# Patient Record
Sex: Female | Born: 1935 | Race: White | Hispanic: No | Smoking: Former smoker
Health system: Southern US, Community
[De-identification: ages and names within clinical notes are randomized; demographics above are authoritative.]

## PROBLEM LIST (undated history)

## (undated) DIAGNOSIS — K219 Gastro-esophageal reflux disease without esophagitis: Secondary | ICD-10-CM

## (undated) DIAGNOSIS — M1990 Unspecified osteoarthritis, unspecified site: Secondary | ICD-10-CM

## (undated) DIAGNOSIS — G459 Transient cerebral ischemic attack, unspecified: Secondary | ICD-10-CM

## (undated) DIAGNOSIS — M810 Age-related osteoporosis without current pathological fracture: Secondary | ICD-10-CM

## (undated) DIAGNOSIS — I639 Cerebral infarction, unspecified: Secondary | ICD-10-CM

## (undated) DIAGNOSIS — K635 Polyp of colon: Secondary | ICD-10-CM

## (undated) DIAGNOSIS — D649 Anemia, unspecified: Secondary | ICD-10-CM

## (undated) DIAGNOSIS — H269 Unspecified cataract: Secondary | ICD-10-CM

## (undated) DIAGNOSIS — I10 Essential (primary) hypertension: Secondary | ICD-10-CM

## (undated) DIAGNOSIS — N189 Chronic kidney disease, unspecified: Secondary | ICD-10-CM

## (undated) DIAGNOSIS — R011 Cardiac murmur, unspecified: Secondary | ICD-10-CM

## (undated) DIAGNOSIS — Z5189 Encounter for other specified aftercare: Secondary | ICD-10-CM

## (undated) DIAGNOSIS — K5790 Diverticulosis of intestine, part unspecified, without perforation or abscess without bleeding: Secondary | ICD-10-CM

## (undated) DIAGNOSIS — D689 Coagulation defect, unspecified: Secondary | ICD-10-CM

## (undated) DIAGNOSIS — E785 Hyperlipidemia, unspecified: Secondary | ICD-10-CM

## (undated) HISTORY — DX: Cerebral infarction, unspecified: I63.9

## (undated) HISTORY — DX: Age-related osteoporosis without current pathological fracture: M81.0

## (undated) HISTORY — PX: COLON SURGERY: SHX602

## (undated) HISTORY — DX: Cardiac murmur, unspecified: R01.1

## (undated) HISTORY — DX: Hyperlipidemia, unspecified: E78.5

## (undated) HISTORY — DX: Anemia, unspecified: D64.9

## (undated) HISTORY — DX: Unspecified osteoarthritis, unspecified site: M19.90

## (undated) HISTORY — DX: Gastro-esophageal reflux disease without esophagitis: K21.9

## (undated) HISTORY — PX: HERNIA REPAIR: SHX51

## (undated) HISTORY — PX: FOOT SURGERY: SHX648

## (undated) HISTORY — DX: Essential (primary) hypertension: I10

## (undated) HISTORY — PX: CHOLECYSTECTOMY: SHX55

## (undated) HISTORY — DX: Encounter for other specified aftercare: Z51.89

## (undated) HISTORY — DX: Polyp of colon: K63.5

## (undated) HISTORY — PX: INCONTINENCE SURGERY: SHX676

## (undated) HISTORY — DX: Diverticulosis of intestine, part unspecified, without perforation or abscess without bleeding: K57.90

## (undated) HISTORY — PX: BACK SURGERY: SHX140

## (undated) HISTORY — DX: Coagulation defect, unspecified: D68.9

## (undated) HISTORY — DX: Unspecified cataract: H26.9

---

## 2018-02-06 ENCOUNTER — Encounter (HOSPITAL_COMMUNITY): Payer: Self-pay | Admitting: Emergency Medicine

## 2018-02-06 ENCOUNTER — Observation Stay (HOSPITAL_COMMUNITY)
Admission: EM | Admit: 2018-02-06 | Discharge: 2018-02-08 | Disposition: A | Discharge status: Home / Self Care | Payer: Medicare Other | Attending: Internal Medicine | Admitting: Internal Medicine

## 2018-02-06 ENCOUNTER — Observation Stay (HOSPITAL_COMMUNITY): Payer: Medicare Other

## 2018-02-06 ENCOUNTER — Emergency Department (HOSPITAL_COMMUNITY): Payer: Medicare Other

## 2018-02-06 DIAGNOSIS — K219 Gastro-esophageal reflux disease without esophagitis: Secondary | ICD-10-CM | POA: Diagnosis not present

## 2018-02-06 DIAGNOSIS — I2510 Atherosclerotic heart disease of native coronary artery without angina pectoris: Secondary | ICD-10-CM | POA: Insufficient documentation

## 2018-02-06 DIAGNOSIS — R079 Chest pain, unspecified: Secondary | ICD-10-CM | POA: Diagnosis not present

## 2018-02-06 DIAGNOSIS — Z8673 Personal history of transient ischemic attack (TIA), and cerebral infarction without residual deficits: Secondary | ICD-10-CM | POA: Diagnosis not present

## 2018-02-06 DIAGNOSIS — R0602 Shortness of breath: Secondary | ICD-10-CM | POA: Insufficient documentation

## 2018-02-06 DIAGNOSIS — I129 Hypertensive chronic kidney disease with stage 1 through stage 4 chronic kidney disease, or unspecified chronic kidney disease: Secondary | ICD-10-CM | POA: Diagnosis not present

## 2018-02-06 DIAGNOSIS — Z79899 Other long term (current) drug therapy: Secondary | ICD-10-CM | POA: Insufficient documentation

## 2018-02-06 DIAGNOSIS — I10 Essential (primary) hypertension: Secondary | ICD-10-CM | POA: Diagnosis not present

## 2018-02-06 DIAGNOSIS — N189 Chronic kidney disease, unspecified: Secondary | ICD-10-CM | POA: Diagnosis not present

## 2018-02-06 DIAGNOSIS — R0902 Hypoxemia: Secondary | ICD-10-CM

## 2018-02-06 DIAGNOSIS — Z7982 Long term (current) use of aspirin: Secondary | ICD-10-CM | POA: Diagnosis not present

## 2018-02-06 DIAGNOSIS — R0789 Other chest pain: Secondary | ICD-10-CM

## 2018-02-06 HISTORY — DX: Chronic kidney disease, unspecified: N18.9

## 2018-02-06 HISTORY — DX: Transient cerebral ischemic attack, unspecified: G45.9

## 2018-02-06 LAB — CBC
HCT: 33.5 % — ABNORMAL LOW (ref 36.0–46.0)
Hemoglobin: 11.3 g/dL — ABNORMAL LOW (ref 12.0–15.0)
MCH: 32.6 pg (ref 26.0–34.0)
MCHC: 33.7 g/dL (ref 30.0–36.0)
MCV: 96.5 fL (ref 78.0–100.0)
Platelets: 350 K/uL (ref 150–400)
RBC: 3.47 MIL/uL — ABNORMAL LOW (ref 3.87–5.11)
RDW: 14.1 % (ref 11.5–15.5)
WBC: 4.4 K/uL (ref 4.0–10.5)

## 2018-02-06 LAB — COMPREHENSIVE METABOLIC PANEL
ALBUMIN: 3.2 g/dL — AB (ref 3.5–5.0)
ALK PHOS: 86 U/L (ref 38–126)
ALT: 17 U/L (ref 14–54)
ANION GAP: 10 (ref 5–15)
AST: 40 U/L (ref 15–41)
BILIRUBIN TOTAL: 1.3 mg/dL — AB (ref 0.3–1.2)
BUN: 13 mg/dL (ref 6–20)
CALCIUM: 8 mg/dL — AB (ref 8.9–10.3)
CO2: 20 mmol/L — ABNORMAL LOW (ref 22–32)
Chloride: 110 mmol/L (ref 101–111)
Creatinine, Ser: 0.8 mg/dL (ref 0.44–1.00)
GFR calc Af Amer: >60 mL/min (ref >60)
GLUCOSE: 79 mg/dL (ref 65–99)
POTASSIUM: 5.1 mmol/L (ref 3.5–5.1)
Sodium: 140 mmol/L (ref 135–145)
TOTAL PROTEIN: 5.3 g/dL — AB (ref 6.5–8.1)

## 2018-02-06 LAB — TROPONIN I: Troponin I: <0.03 ng/mL (ref <0.03)

## 2018-02-06 LAB — I-STAT TROPONIN, ED: TROPONIN I, POC: 0.01 ng/mL (ref 0.00–0.08)

## 2018-02-06 LAB — LIPASE, BLOOD: LIPASE: 32 U/L (ref 11–51)

## 2018-02-06 LAB — BRAIN NATRIURETIC PEPTIDE: B Natriuretic Peptide: 58 pg/mL (ref 0.0–100.0)

## 2018-02-06 MED ORDER — NITROGLYCERIN 0.4 MG SL SUBL
0.4000 mg | SUBLINGUAL_TABLET | SUBLINGUAL | Status: DC | PRN
  Administered 2018-02-06: 0.4 mg via SUBLINGUAL
  Filled 2018-02-06: qty 1

## 2018-02-06 MED ORDER — GABAPENTIN 300 MG PO CAPS
600.0000 mg | ORAL_CAPSULE | Freq: Every day | ORAL | Status: DC
  Administered 2018-02-07 – 2018-02-08 (×2): 600 mg via ORAL
  Filled 2018-02-06 (×2): qty 2

## 2018-02-06 MED ORDER — IOPAMIDOL (ISOVUE-370) INJECTION 76%
INTRAVENOUS | Status: AC
Start: 2018-02-06 — End: 2018-02-06
  Administered 2018-02-06: 100 mL
  Filled 2018-02-06: qty 100

## 2018-02-06 MED ORDER — ASPIRIN 325 MG PO TABS
325.0000 mg | ORAL_TABLET | Freq: Every day | ORAL | Status: DC
  Administered 2018-02-07 – 2018-02-08 (×2): 325 mg via ORAL
  Filled 2018-02-06 (×3): qty 1

## 2018-02-06 MED ORDER — GABAPENTIN 300 MG PO CAPS
900.0000 mg | ORAL_CAPSULE | Freq: Every day | ORAL | Status: DC
  Administered 2018-02-07 (×2): 900 mg via ORAL
  Filled 2018-02-06 (×2): qty 3

## 2018-02-06 MED ORDER — ENOXAPARIN SODIUM 40 MG/0.4ML ~~LOC~~ SOLN
40.0000 mg | SUBCUTANEOUS | Status: DC
  Administered 2018-02-07: 40 mg via SUBCUTANEOUS
  Filled 2018-02-06 (×2): qty 0.4

## 2018-02-06 MED ORDER — VERAPAMIL HCL ER 120 MG PO TBCR
120.0000 mg | EXTENDED_RELEASE_TABLET | Freq: Every day | ORAL | Status: DC
  Administered 2018-02-07 – 2018-02-08 (×2): 120 mg via ORAL
  Filled 2018-02-06 (×3): qty 1

## 2018-02-06 MED ORDER — VERAPAMIL HCL ER 240 MG PO CP24: 240.0000 mg | ORAL_CAPSULE | Freq: Every day | ORAL | Status: DC

## 2018-02-06 MED ORDER — MORPHINE SULFATE (PF) 4 MG/ML IV SOLN: 2.0000 mg | INTRAVENOUS | Status: DC | PRN

## 2018-02-06 MED ORDER — ONDANSETRON HCL 4 MG/2ML IJ SOLN
4.0000 mg | Freq: Four times a day (QID) | INTRAMUSCULAR | Status: DC | PRN
Start: 2018-02-06 — End: 2018-02-08

## 2018-02-06 MED ORDER — ACETAMINOPHEN 325 MG PO TABS: 650.0000 mg | ORAL_TABLET | ORAL | Status: DC | PRN

## 2018-02-06 MED ORDER — ONDANSETRON HCL 4 MG/2ML IJ SOLN
4.0000 mg | Freq: Once | INTRAMUSCULAR | Status: AC
  Administered 2018-02-07: 4 mg via INTRAVENOUS
  Filled 2018-02-06: qty 2

## 2018-02-06 MED ORDER — SODIUM CHLORIDE 0.9 % IV SOLN
Freq: Once | INTRAVENOUS | Status: AC
  Administered 2018-02-07: via INTRAVENOUS

## 2018-02-06 MED ORDER — FENTANYL CITRATE (PF) 100 MCG/2ML IJ SOLN
50.0000 ug | Freq: Once | INTRAMUSCULAR | Status: AC
  Administered 2018-02-07: 50 ug via INTRAVENOUS
  Filled 2018-02-06: qty 2

## 2018-02-06 NOTE — H&P (Addendum)
History and Physical    Jill Mcdonald NWG:[id number] DOB: 1930-02-26 DOA: 02/06/2018  PCP: Adrian Prince, MD  Patient coming from: Home  I have personally briefly reviewed patient's old medical records in [facility]  Chief Complaint: Chest pain  HPI: Jill Mcdonald is a 84 y.o. female with medical history significant of TIA, HTN.  Patient presents to the ED with c/o central CP.  Dull, aching, substernal, pressure like sensation.  Ongoing for past several weeks.  Associated SOB.  Worse with exertion.  Over last several hours has had acute CP even at rest.  Actually got worse when EMS tried NTG on her.   ED Course: CP currently 5/10.  Noted to be hypoxic on room air.  Trop neg.  CTA chest PE pending.  CXR does show an age indeterminate compression fracture.   Review of Systems: As per HPI otherwise 10 point review of systems negative.   Past Medical History:  Diagnosis Date  . CKD (chronic kidney disease)   . TIA (transient ischemic attack)     History reviewed. No pertinent surgical history.   reports that  has never smoked. she has never used smokeless tobacco. She reports that she does not drink alcohol or use drugs.  Allergies  Allergen Reactions  . Other     Patient states she is allergic to 36 things but does not remember the name.     No family history on file.   Prior to Admission medications   Medication Sig Start Date End Date Taking? Authorizing Provider  aspirin 325 MG tablet Take 325 mg by mouth daily.   Yes [provider]  gabapentin (NEURONTIN) 300 MG capsule Take 600 mg by mouth 2 (two) times daily.   Yes [provider]  ranitidine (ZANTAC) 150 MG tablet Take 150 mg by mouth daily as needed for heartburn.   Yes [provider]  traMADol (ULTRAM) 50 MG tablet Take 50 mg by mouth every 6 (six) hours as needed for moderate pain.   Yes [provider]  verapamil (CALAN) 120 MG tablet Take 120 mg by mouth at  bedtime.   Yes [provider]  verapamil (VERELAN PM) 240 MG 24 hr capsule Take 240 mg by mouth daily.   Yes [provider]  zolpidem (AMBIEN) 5 MG tablet Take 5 mg by mouth at bedtime as needed for sleep.   Yes [provider]    Physical Exam: Vitals:   02/06/18 2100 02/06/18 2111 02/06/18 2112 02/06/18 2130  BP:  120/73  134/86  Pulse: 77  85 83  Resp: (!) 30  (!) 21 17  SpO2: 94%  93% (!) 87%  Weight:      Height:        Constitutional: NAD, calm, comfortable Eyes: PERRL, lids and conjunctivae normal ENMT: Mucous membranes are moist. Posterior pharynx clear of any exudate or lesions.Normal dentition.  Neck: normal, supple, no masses, no thyromegaly Respiratory: clear to auscultation bilaterally, no wheezing, no crackles. Normal respiratory effort. No accessory muscle use.  Cardiovascular: Regular rate and rhythm, no murmurs / rubs / gallops. No extremity edema. 2+ pedal pulses. No carotid bruits.  Abdomen: no tenderness, no masses palpated. No hepatosplenomegaly. Bowel sounds positive.  Musculoskeletal: no clubbing / cyanosis. No joint deformity upper and lower extremities. Good ROM, no contractures. Normal muscle tone.  Skin: no rashes, lesions, ulcers. No induration Neurologic: CN 2-12 grossly intact. Sensation intact, DTR normal. Strength 5/5 in all 4.  Psychiatric:  Normal judgment and insight. Alert and oriented x 3. Normal mood.    Labs on Admission: I have personally reviewed following labs and imaging studies  CBC: Recent Labs  Lab 02/06/18 1803  WBC 4.4  HGB 11.3*  HCT 33.5*  MCV 96.5  PLT 350   Basic Metabolic Panel: Recent Labs  Lab 02/06/18 1820  NA 140  K 5.1  CL 110  CO2 20*  GLUCOSE 79  BUN 13  CREATININE 0.80  CALCIUM 8.0*   GFR: Estimated Creatinine Clearance: 37.6 mL/min (by C-G formula based on SCr of 0.8 mg/dL). Liver Function Tests: Recent Labs  Lab 02/06/18 1820  AST 40  ALT 17  ALKPHOS 86  BILITOT  1.3*  PROT 5.3*  ALBUMIN 3.2*   Recent Labs  Lab 02/06/18 1820  LIPASE 32   No results for input(s): AMMONIA in the last 168 hours. Coagulation Profile: No results for input(s): INR, PROTIME in the last 168 hours. Cardiac Enzymes: Recent Labs  Lab 02/06/18 1820  TROPONINI <0.03   BNP (last 3 results) No results for input(s): PROBNP in the last 8760 hours. HbA1C: No results for input(s): HGBA1C in the last 72 hours. CBG: No results for input(s): GLUCAP in the last 168 hours. Lipid Profile: No results for input(s): CHOL, HDL, LDLCALC, TRIG, CHOLHDL, LDLDIRECT in the last 72 hours. Thyroid Function Tests: No results for input(s): TSH, T4TOTAL, FREET4, T3FREE, THYROIDAB in the last 72 hours. Anemia Panel: No results for input(s): VITAMINB12, FOLATE, FERRITIN, TIBC, IRON, RETICCTPCT in the last 72 hours. Urine analysis: No results found for: COLORURINE, APPEARANCEUR, LABSPEC, PHURINE, GLUCOSEU, HGBUR, BILIRUBINUR, KETONESUR, PROTEINUR, UROBILINOGEN, NITRITE, LEUKOCYTESUR  Radiological Exams on Admission: Dg Chest 2 View  Result Date: 02/06/2018 CLINICAL DATA:  84 y/o F; 1 day of central chest pain and shortness of breath. EXAM: CHEST - 2 VIEW COMPARISON:  None. FINDINGS: Midthoracic anterior compression deformity with 60% loss of height, probably T7. Bones are otherwise unremarkable. Cardiomediastinal silhouette within normal limits given projection and technique. Calcific aortic atherosclerosis. Clear lungs. IMPRESSION: No acute pulmonary process identified. T7 moderate to severe anterior compression deformity, age indeterminate. Electronically Signed   By: Mitzi HansenLance  Furusawa-Stratton M.D.   On: 02/06/2018 18:33    EKG: Independently reviewed.  Assessment/Plan Principal Problem:   Chest pain, rule out acute myocardial infarction Active Problems:   HTN (hypertension)    1. CP r/o - 1. CTA chest PE pending - if positive for something then will treat that obviously 2. CP obs  pathway 3. Serial trops 4. Tele monitor 5. NPO after MN 6. Cards eval in AM 7. Morphine PRN pain 2. HTN - continue calan  DVT prophylaxis: Lovenox [place] Code Status: Full Family Communication: Daughter at bedside Disposition Plan: Home after admit Consults called: EDP spoke with cards who said for med to admit, put in msg to P.Trent for eval in AM Admission status: [place]   [place], Kentucky[place] M. DO [facility] Pager [phone]  If 7AM-7PM, please contact day team taking care of patient [email or web address] Password [email or web address]  02/06/2018, 10:00 PM

## 2018-02-06 NOTE — ED Triage Notes (Signed)
Per EMS: Pt c/o CP, central, tightness x 2 weeks w/ SOB. Pain getting worse today. Pt given 324 ASP and 1 of nitro. VSS NSR.A&Ox4. Pt from [facility].

## 2018-02-06 NOTE — ED Notes (Signed)
Placed Pt on 2L [place]

## 2018-02-06 NOTE — ED Provider Notes (Addendum)
[facility]   CSN: [id number] Arrival date & time: 02/06/18  1747     History   Chief Complaint Chief Complaint  Patient presents with  . Chest Pain    HPI Charlett BlakeShelia M Knobloch is a 84 y.o. female.  HPI very pleasant 84 year old female with history of hypertension and mild CKD here with chest pain.  The patient states that for the last several weeks, she is had progressively worsening dull, aching, substernal chest pressure.  She is noticed that she has had chest pressure and shortness of breath with exertion.  This has become increasingly more severe with less and less exertion.  She is now having symptoms at rest.  The patient has also had some occasional episodes of symptoms that occur even at rest.  Over the last several hours, she had acute onset of this pain at rest.  It is severe.  She has associated sensation that she cannot catch her breath.  No nausea or diaphoresis.  No history of previous coronary disease.  She did reportedly have a cardiac evaluation but this was approximately 10 years ago.  Past Medical History:  Diagnosis Date  . CKD (chronic kidney disease)   . TIA (transient ischemic attack)     Patient Active Problem List   Diagnosis Date Noted  . Chest pain, rule out acute myocardial infarction 02/06/2018  . HTN (hypertension) 02/06/2018    History reviewed. No pertinent surgical history.  OB History    No data available       Home Medications    Prior to Admission medications   Medication Sig Start Date End Date Taking? Authorizing Provider  aspirin 325 MG tablet Take 325 mg by mouth daily.   Yes [provider]  gabapentin (NEURONTIN) 300 MG capsule Take 600 mg by mouth 2 (two) times daily.   Yes [provider]  ranitidine (ZANTAC) 150 MG tablet Take 150 mg by mouth daily as needed for heartburn.   Yes [provider]  traMADol (ULTRAM) 50 MG tablet Take 50 mg by mouth  every 6 (six) hours as needed for moderate pain.   Yes [provider]  verapamil (CALAN) 120 MG tablet Take 120 mg by mouth at bedtime.   Yes [provider]  verapamil (VERELAN PM) 240 MG 24 hr capsule Take 240 mg by mouth daily.   Yes [provider]  zolpidem (AMBIEN) 5 MG tablet Take 5 mg by mouth at bedtime as needed for sleep.   Yes [provider]    Family History No family history on file.  Social History Social History   Tobacco Use  . Smoking status: Never Smoker  . Smokeless tobacco: Never Used  Substance Use Topics  . Alcohol use: No    Frequency: Never  . Drug use: No     Allergies   Other   Review of Systems Review of Systems  Respiratory: Positive for cough, chest tightness and shortness of breath.   Cardiovascular: Positive for chest pain.  Neurological: Positive for weakness.  All other systems reviewed and are negative.    Physical Exam Updated Vital Signs BP (!) 152/85   Pulse 83   Resp 17   Ht 5\' 1"  (1.549 m)   Wt 46.3 kg (102 lb)   SpO2 (!) 87%   BMI 19.27 kg/m   Physical Exam  Constitutional: She is oriented to person, place, and time. She appears well-developed and well-nourished. No distress.  HENT:  Head: Normocephalic and atraumatic.  Eyes: Conjunctivae are normal.  Neck: Neck supple.  Cardiovascular: Normal rate, regular rhythm and normal heart sounds. Exam reveals no friction rub.  No murmur heard. Pulmonary/Chest: Effort normal and breath sounds normal. No respiratory distress. She has no wheezes. She has no rales.  Abdominal: She exhibits no distension.  Musculoskeletal: She exhibits no edema.  Neurological: She is alert and oriented to person, place, and time. She exhibits normal muscle tone.  Skin: Skin is warm. Capillary refill takes less than 2 seconds.  Psychiatric: She has a normal mood and affect.  Nursing note and vitals reviewed.    ED Treatments / Results  Labs (all labs  ordered are listed, but only abnormal results are displayed) Labs Reviewed  CBC - Abnormal; Notable for the following components:      Result Value   RBC 3.47 (*)    Hemoglobin 11.3 (*)    HCT 33.5 (*)    All other components within normal limits  COMPREHENSIVE METABOLIC PANEL - Abnormal; Notable for the following components:   CO2 20 (*)    Calcium 8.0 (*)    Total Protein 5.3 (*)    Albumin 3.2 (*)    Total Bilirubin 1.3 (*)    All other components within normal limits  LIPASE, BLOOD  TROPONIN I  BRAIN NATRIURETIC PEPTIDE  TROPONIN I  TROPONIN I  TROPONIN I  I-STAT TROPONIN, ED    EKG  EKG Interpretation  Date/Time:  Thursday February 06 2018 18:02:55 EST Ventricular Rate:  81 PR Interval:    QRS Duration: 100 QT Interval:  418 QTC Calculation: 486 R Axis:   -36 Text Interpretation:  Sinus rhythm Left axis deviation Borderline low voltage, extremity leads Borderline prolonged QT interval No old tracing to compare Confirmed by Shaune Pollack [phone]) on 02/06/2018 6:50:39 PM       Radiology Dg Chest 2 View  Result Date: 02/06/2018 CLINICAL DATA:  84 y/o F; 1 day of central chest pain and shortness of breath. EXAM: CHEST - 2 VIEW COMPARISON:  None. FINDINGS: Midthoracic anterior compression deformity with 60% loss of height, probably T7. Bones are otherwise unremarkable. Cardiomediastinal silhouette within normal limits given projection and technique. Calcific aortic atherosclerosis. Clear lungs. IMPRESSION: No acute pulmonary process identified. T7 moderate to severe anterior compression deformity, age indeterminate. Electronically Signed   By: Mitzi Hansen M.D.   On: 02/06/2018 18:33   Ct Angio Chest Pe W Or Wo Contrast  Result Date: 02/06/2018 CLINICAL DATA:  Short of breath EXAM: CT ANGIOGRAPHY CHEST WITH CONTRAST TECHNIQUE: Multidetector CT imaging of the chest was performed using the standard protocol during bolus administration of intravenous contrast.  Multiplanar CT image reconstructions and MIPs were obtained to evaluate the vascular anatomy. CONTRAST:  ISOVUE-370 IOPAMIDOL (ISOVUE-370) INJECTION 76% COMPARISON:  Chest x-ray 02/06/2018 FINDINGS: Cardiovascular: Satisfactory opacification of the pulmonary arteries to the segmental level. No evidence of pulmonary embolism. Ectatic ascending aorta measuring up to 3.8 cm. Common origin of the right brachiocephalic and left common carotid artery. Moderate aortic atherosclerosis. No dissection seen. Coronary artery calcification. Borderline heart size. No significant pericardial effusion. Mediastinum/Nodes: Midline trachea. No thyroid mass. No significantly enlarged lymph nodes. Esophagus within normal limits. Small calcified subcarinal lymph node consistent with prior granulomatous disease. Lungs/Pleura: No focal consolidation or effusion. Granuloma in the left lower lobe. Minimal tree-in-bud density peripheral right upper lobe. Upper Abdomen: No acute abnormality. Musculoskeletal: Severe compression fracture at T7, likely chronic. Thoracic stimulator the  tip at T8. Review of the MIP images confirms the above findings. IMPRESSION: 1. Negative for acute pulmonary embolus. 2. Minimal tree-in-bud density in the right upper lobe peripherally suggesting bronchiolitis/infection. No focal consolidation 3. Prior granulomatous disease. 4. Severe chronic appearing compression fracture at T7 Aortic Atherosclerosis (ICD10-I70.0). Electronically Signed   By: Jasmine Pang M.D.   On: 02/06/2018 23:08    Procedures Procedures (including critical care time)  Medications Ordered in ED Medications  nitroGLYCERIN (NITROSTAT) SL tablet 0.4 mg (0.4 mg Sublingual Given 02/06/18 2100)  verapamil (CALAN-SR) CR tablet 120 mg (120 mg Oral Given 02/07/18 0001)  verapamil (VERELAN PM) 24 hr capsule CP24 240 mg (not administered)  gabapentin (NEURONTIN) capsule 900 mg (900 mg Oral Given 02/07/18 0000)  aspirin tablet 325 mg (not  administered)  morphine 4 MG/ML injection 2 mg (not administered)  acetaminophen (TYLENOL) tablet 650 mg (not administered)  ondansetron (ZOFRAN) injection 4 mg (not administered)  enoxaparin (LOVENOX) injection 40 mg (not administered)  gabapentin (NEURONTIN) capsule 600 mg (not administered)  fentaNYL (SUBLIMAZE) injection 50 mcg (50 mcg Intravenous Given 02/07/18 0000)  ondansetron (ZOFRAN) injection 4 mg (4 mg Intravenous Given 02/07/18 0000)  0.9 %  sodium chloride infusion ( Intravenous New Bag/Given 02/07/18 0006)  iopamidol (ISOVUE-370) 76 % injection (100 mLs  Contrast Given 02/06/18 2220)     Initial Impression / Assessment and Plan / ED Course  I have reviewed the triage vital signs and the nursing notes.  Pertinent labs & imaging results that were available during my care of the patient were reviewed by me and considered in my medical decision making (see chart for details).    84 year old female here with a story very concerning for possible worsening unstable angina.  However, EKG here is nonischemic and initial troponin is negative.  Given her story, I suspect she should be admitted for high risk chest pain evaluation.  Otherwise, chest x-ray clear without evidence of pneumonia, pneumothorax, or other abnormality.  She has no known coronary disease.  Discussed the case with cardiology on-call, who is available for consultation and agrees with hospitalist admission at this time.  Will trial opiates for pain control as she had poor response to nitroglycerin with a headache.  Otherwise, pt noted to be mildly hypoxic here. Given reported CP, tachypnea CT angio ordered - Dr. Beacher May to f/u. Admit to Hospitalist.  Final Clinical Impressions(s) / ED Diagnoses   Final diagnoses:  Atypical chest pain  Hypoxia    ED Discharge Orders    None       Shaune Pollack, MD 02/07/18 Venita Sheffield, MD 02/07/18 [phone]

## 2018-02-07 ENCOUNTER — Encounter (HOSPITAL_COMMUNITY)
Admission: EM | Disposition: A | Discharge status: Home / Self Care | Payer: Self-pay | Source: Home / Self Care | Attending: Emergency Medicine

## 2018-02-07 ENCOUNTER — Observation Stay (HOSPITAL_COMMUNITY): Payer: Medicare Other

## 2018-02-07 ENCOUNTER — Encounter (HOSPITAL_COMMUNITY): Payer: Self-pay | Admitting: Physician Assistant

## 2018-02-07 DIAGNOSIS — I2510 Atherosclerotic heart disease of native coronary artery without angina pectoris: Secondary | ICD-10-CM | POA: Diagnosis not present

## 2018-02-07 DIAGNOSIS — K219 Gastro-esophageal reflux disease without esophagitis: Secondary | ICD-10-CM | POA: Diagnosis not present

## 2018-02-07 DIAGNOSIS — I10 Essential (primary) hypertension: Secondary | ICD-10-CM | POA: Diagnosis not present

## 2018-02-07 DIAGNOSIS — R079 Chest pain, unspecified: Secondary | ICD-10-CM | POA: Diagnosis not present

## 2018-02-07 HISTORY — PX: LEFT HEART CATH AND CORONARY ANGIOGRAPHY: CATH118249

## 2018-02-07 LAB — TROPONIN I: Troponin I: <0.03 ng/mL (ref <0.03)

## 2018-02-07 SURGERY — LEFT HEART CATH AND CORONARY ANGIOGRAPHY
Anesthesia: LOCAL

## 2018-02-07 MED ORDER — SODIUM CHLORIDE 0.9 % IV SOLN: INTRAVENOUS | Status: AC

## 2018-02-07 MED ORDER — VERAPAMIL HCL 2.5 MG/ML IV SOLN
INTRAVENOUS | Status: AC
  Filled 2018-02-07: qty 2

## 2018-02-07 MED ORDER — ASPIRIN 81 MG PO CHEW: 81.0000 mg | CHEWABLE_TABLET | ORAL | Status: DC

## 2018-02-07 MED ORDER — ENOXAPARIN SODIUM 40 MG/0.4ML ~~LOC~~ SOLN
40.0000 mg | SUBCUTANEOUS | Status: DC
  Filled 2018-02-07: qty 0.4

## 2018-02-07 MED ORDER — LIDOCAINE HCL (PF) 1 % IJ SOLN
INTRAMUSCULAR | Status: DC | PRN
  Administered 2018-02-07: 2 mL

## 2018-02-07 MED ORDER — ENSURE ENLIVE PO LIQD
237.0000 mL | Freq: Two times a day (BID) | ORAL | Status: DC
  Administered 2018-02-08: 237 mL via ORAL

## 2018-02-07 MED ORDER — VERAPAMIL HCL 2.5 MG/ML IV SOLN
INTRAVENOUS | Status: DC | PRN
  Administered 2018-02-07: 10 mL via INTRA_ARTERIAL

## 2018-02-07 MED ORDER — SODIUM CHLORIDE 0.9 % IV SOLN: 250.0000 mL | INTRAVENOUS | Status: DC | PRN

## 2018-02-07 MED ORDER — HEPARIN SODIUM (PORCINE) 1000 UNIT/ML IJ SOLN
INTRAMUSCULAR | Status: AC
  Filled 2018-02-07: qty 1

## 2018-02-07 MED ORDER — MIDAZOLAM HCL 2 MG/2ML IJ SOLN
INTRAMUSCULAR | Status: AC
  Filled 2018-02-07: qty 2

## 2018-02-07 MED ORDER — ZOLPIDEM TARTRATE 5 MG PO TABS
5.0000 mg | ORAL_TABLET | Freq: Every evening | ORAL | Status: DC | PRN
  Administered 2018-02-07: 5 mg via ORAL
  Filled 2018-02-07: qty 1

## 2018-02-07 MED ORDER — ACETAMINOPHEN 325 MG PO TABS: 650.0000 mg | ORAL_TABLET | ORAL | Status: DC | PRN

## 2018-02-07 MED ORDER — VERAPAMIL HCL ER 240 MG PO TBCR
240.0000 mg | EXTENDED_RELEASE_TABLET | Freq: Every day | ORAL | Status: DC
  Administered 2018-02-08: 240 mg via ORAL
  Filled 2018-02-07: qty 1

## 2018-02-07 MED ORDER — FENTANYL CITRATE (PF) 100 MCG/2ML IJ SOLN
INTRAMUSCULAR | Status: AC
  Filled 2018-02-07: qty 2

## 2018-02-07 MED ORDER — SODIUM CHLORIDE 0.9 % WEIGHT BASED INFUSION: 1.0000 mL/kg/h | INTRAVENOUS | Status: DC

## 2018-02-07 MED ORDER — LIDOCAINE HCL (PF) 1 % IJ SOLN
INTRAMUSCULAR | Status: AC
  Filled 2018-02-07: qty 30

## 2018-02-07 MED ORDER — ONDANSETRON HCL 4 MG/2ML IJ SOLN: 4.0000 mg | Freq: Four times a day (QID) | INTRAMUSCULAR | Status: DC | PRN

## 2018-02-07 MED ORDER — SODIUM CHLORIDE 0.9 % WEIGHT BASED INFUSION: 3.0000 mL/kg/h | INTRAVENOUS | Status: DC

## 2018-02-07 MED ORDER — MIDAZOLAM HCL 2 MG/2ML IJ SOLN
INTRAMUSCULAR | Status: DC | PRN
  Administered 2018-02-07: 1 mg via INTRAVENOUS

## 2018-02-07 MED ORDER — HEPARIN SODIUM (PORCINE) 1000 UNIT/ML IJ SOLN
INTRAMUSCULAR | Status: DC | PRN
  Administered 2018-02-07: 2000 [IU] via INTRAVENOUS

## 2018-02-07 MED ORDER — FENTANYL CITRATE (PF) 100 MCG/2ML IJ SOLN
INTRAMUSCULAR | Status: DC | PRN
  Administered 2018-02-07: 25 ug via INTRAVENOUS

## 2018-02-07 MED ORDER — FAMOTIDINE 20 MG PO TABS
20.0000 mg | ORAL_TABLET | Freq: Every day | ORAL | Status: DC
  Administered 2018-02-08: 20 mg via ORAL
  Filled 2018-02-07: qty 1

## 2018-02-07 MED ORDER — SODIUM CHLORIDE 0.9% FLUSH: 3.0000 mL | INTRAVENOUS | Status: DC | PRN

## 2018-02-07 MED ORDER — SODIUM CHLORIDE 0.9% FLUSH: 3.0000 mL | Freq: Two times a day (BID) | INTRAVENOUS | Status: DC

## 2018-02-07 MED ORDER — SODIUM CHLORIDE 0.9 % IV SOLN
INTRAVENOUS | Status: DC
  Administered 2018-02-07: 14:00:00 via INTRAVENOUS

## 2018-02-07 MED ORDER — IOPAMIDOL (ISOVUE-370) INJECTION 76%
INTRAVENOUS | Status: AC
  Filled 2018-02-07: qty 100

## 2018-02-07 MED ORDER — IOPAMIDOL (ISOVUE-370) INJECTION 76%
INTRAVENOUS | Status: DC | PRN
  Administered 2018-02-07: 60 mL via INTRA_ARTERIAL

## 2018-02-07 MED ORDER — HEPARIN (PORCINE) IN NACL 2-0.9 UNIT/ML-% IJ SOLN
INTRAMUSCULAR | Status: AC | PRN
  Administered 2018-02-07 (×2): 500 mL

## 2018-02-07 MED ORDER — HEPARIN (PORCINE) IN NACL 2-0.9 UNIT/ML-% IJ SOLN
INTRAMUSCULAR | Status: AC
  Filled 2018-02-07: qty 1000

## 2018-02-07 SURGICAL SUPPLY — 12 items
CATH IMPULSE 5F ANG/FL3.5 (CATHETERS) ×2 IMPLANT
CATH INFINITI 5FR JL4 (CATHETERS) ×2 IMPLANT
DEVICE RAD COMP TR BAND LRG (VASCULAR PRODUCTS) IMPLANT
DEVICE RAD TR BAND REGULAR (VASCULAR PRODUCTS) ×2 IMPLANT
GLIDESHEATH SLEND SS 6F .021 (SHEATH) ×4 IMPLANT
GUIDEWIRE INQWIRE 1.5J.035X260 (WIRE) ×1 IMPLANT
INQWIRE 1.5J .035X260CM (WIRE) ×2
KIT HEART LEFT (KITS) ×2 IMPLANT
PACK CARDIAC CATHETERIZATION (CUSTOM PROCEDURE TRAY) ×2 IMPLANT
TRANSDUCER W/STOPCOCK (MISCELLANEOUS) ×2 IMPLANT
TUBING CIL FLEX 10 FLL-RA (TUBING) ×2 IMPLANT
WIRE HI TORQ VERSACORE-J 145CM (WIRE) ×2 IMPLANT

## 2018-02-07 NOTE — Consult Note (Addendum)
Cardiology Consultation:   Patient ID: Jill Mcdonald; [id number]; Apr 08, 1930   Admit date: 02/06/2018 Date of Consult: 02/07/2018  Primary Care Provider: Adrian Prince, MD Primary Cardiologist: new - Dr. Tresa Endo Primary Electrophysiologist:     Patient Profile:   Jill Mcdonald is a 84 y.o. female with a hx of HTN, TIA x 2, and ?CKD who is being seen today for the evaluation of chest pain at the request of Dr. Malachi Bonds.  History of Present Illness:   [name] was a resident of [place], [place] and recently moved here last fall. She lives in an assisted living center and has a daughter in the area. She had a cardiologist and a nephrologist in [place]. She had a heart cath in Jan/2006 and was told she had blockages, but no stents placed. (Awaiting records).   It sounds as though her anginal equivalent is shortness of breath. She had SOB and DOE prior to her cath in 2006. She never had chest pain. She states that she started getting SOB again last August 2018. She did not see her cardiologist for this because she knew she was relocating to [facility]. Her SOB and DOE have gotten much worse over the past several weeks. Two days ago, she also noticed a new chest pain in her central inferior sternal area. The chest pain has been constant for the past 2 days. She denies orthopnea, but can't lay flat because of back pain. She denies lower extremity swelling, palpitations, dizziness, and pre/syncope. Yesterday, she was too weak to even get off the cough and EMS was dispatched to her room. She also reports a significant weight loss over the past several weeks. She is very active and independent on most ADLs. She has not fallen in the past 3 years.  On arrival to the ER, initial troponin was negative. EKG with poor R wave progression, no prior for comparison. She appears to be euvolemic, maybe even a bit dehydrated.    Past Medical History:  Diagnosis Date  . CKD (chronic kidney disease)   . TIA (transient ischemic  attack)     History reviewed. No pertinent surgical history.   Home Medications:  Prior to Admission medications   Medication Sig Start Date End Date Taking? Authorizing Provider  aspirin 325 MG tablet Take 325 mg by mouth daily.   Yes [provider]  gabapentin (NEURONTIN) 300 MG capsule Take 600 mg by mouth 2 (two) times daily.   Yes [provider]  ranitidine (ZANTAC) 150 MG tablet Take 150 mg by mouth daily as needed for heartburn.   Yes [provider]  traMADol (ULTRAM) 50 MG tablet Take 50 mg by mouth every 6 (six) hours as needed for moderate pain.   Yes [provider]  verapamil (CALAN) 120 MG tablet Take 120 mg by mouth at bedtime.   Yes [provider]  verapamil (VERELAN PM) 240 MG 24 hr capsule Take 240 mg by mouth daily.   Yes [provider]  zolpidem (AMBIEN) 5 MG tablet Take 5 mg by mouth at bedtime as needed for sleep.   Yes [provider]    Inpatient Medications: Scheduled Meds: . aspirin  325 mg Oral Daily  . enoxaparin (LOVENOX) injection  40 mg Subcutaneous Q24H  . gabapentin  600 mg Oral Daily  . gabapentin  900 mg Oral QHS  . verapamil  120 mg Oral QHS  . verapamil  240 mg Oral Daily   Continuous Infusions:  PRN Meds:  acetaminophen, morphine injection, nitroGLYCERIN, ondansetron (ZOFRAN) IV  Allergies:    Allergies  Allergen Reactions  . Other     Patient states she is allergic to 36 things but does not remember the name.     Social History:   Social History   Socioeconomic History  . Marital status: Single    Spouse name: Not on file  . Number of children: Not on file  . Years of education: Not on file  . Highest education level: Not on file  Social Needs  . Financial resource strain: Not on file  . Food insecurity - worry: Not on file  . Food insecurity - inability: Not on file  . Transportation needs - medical: Not on file  . Transportation needs - non-medical: Not on  file  Occupational History  . Not on file  Tobacco Use  . Smoking status: Never Smoker  . Smokeless tobacco: Never Used  Substance and Sexual Activity  . Alcohol use: No    Frequency: Never  . Drug use: No  . Sexual activity: No  Other Topics Concern  . Not on file  Social History Narrative  . Not on file    Family History:    Family History  Problem Relation Age of Onset  . Hypertension Father      ROS:  Please see the history of present illness.   All other ROS reviewed and negative.     Physical Exam/Data:   Vitals:   02/07/18 0645 02/07/18 0700 02/07/18 0715 02/07/18 0800  BP: 120/73 120/75 117/72 114/69  Pulse: 73 73 75 73  Resp: 15 15 14 16   SpO2: 97% 96% 97% 98%  Weight:      Height:        Intake/Output Summary (Last 24 hours) at 02/07/2018 1226 Last data filed at 02/07/2018 1143 Gross per 24 hour  Intake 1000 ml  Output -  Net 1000 ml   Filed Weights   02/06/18 1751  Weight: 102 lb (46.3 kg)   Body mass index is 19.27 kg/m.  General:  Well nourished, well developed, in no acute distress HEENT: normal Neck: no JVD Vascular: No carotid bruits  Cardiac:  normal S1, S2; RRR; no murmur Lungs:  clear to auscultation bilaterally, no wheezing, rhonchi or rales, diminished in bases Abd: soft, nontender, no hepatomegaly  Ext: no edema Musculoskeletal:  No deformities, BUE and BLE strength normal and equal Skin: warm and dry  Neuro:  CNs 2-12 intact, no focal abnormalities noted Psych:  Normal affect   EKG:  The EKG was personally reviewed and demonstrates:  Sinus, poor R wave progression Telemetry:  Telemetry was personally reviewed and demonstrates:  sinus  Relevant CV Studies:  Awaiting records  Laboratory Data:  Chemistry Recent Labs  Lab 02/06/18 1820  NA 140  K 5.1  CL 110  CO2 20*  GLUCOSE 79  BUN 13  CREATININE 0.80  CALCIUM 8.0*  GFRNONAA >60  GFRAA >60  ANIONGAP 10    Recent Labs  Lab 02/06/18 1820  PROT 5.3*  ALBUMIN  3.2*  AST 40  ALT 17  ALKPHOS 86  BILITOT 1.3*   Hematology Recent Labs  Lab 02/06/18 1803  WBC 4.4  RBC 3.47*  HGB 11.3*  HCT 33.5*  MCV 96.5  MCH 32.6  MCHC 33.7  RDW 14.1  PLT 350   Cardiac Enzymes Recent Labs  Lab 02/06/18 1820 02/06/18 2315 02/07/18 0441  TROPONINI <0.03 <0.03 <0.03    Recent Labs  Lab 02/06/18 1813  TROPIPOC 0.01    BNP Recent Labs  Lab 02/06/18 1820  BNP 58.0    DDimer No results for input(s): DDIMER in the last 168 hours.   Lipid Panel  No results found for: CHOL, TRIG, HDL, CHOLHDL, VLDL, LDLCALC, LDLDIRECT  Radiology/Studies:  Dg Chest 2 View  Result Date: 02/06/2018 CLINICAL DATA:  84 y/o F; 1 day of central chest pain and shortness of breath. EXAM: CHEST - 2 VIEW COMPARISON:  None. FINDINGS: Midthoracic anterior compression deformity with 60% loss of height, probably T7. Bones are otherwise unremarkable. Cardiomediastinal silhouette within normal limits given projection and technique. Calcific aortic atherosclerosis. Clear lungs. IMPRESSION: No acute pulmonary process identified. T7 moderate to severe anterior compression deformity, age indeterminate. Electronically Signed   By: Mitzi Hansen M.D.   On: 02/06/2018 18:33   Ct Angio Chest Pe W Or Wo Contrast  Result Date: 02/06/2018 CLINICAL DATA:  Short of breath EXAM: CT ANGIOGRAPHY CHEST WITH CONTRAST TECHNIQUE: Multidetector CT imaging of the chest was performed using the standard protocol during bolus administration of intravenous contrast. Multiplanar CT image reconstructions and MIPs were obtained to evaluate the vascular anatomy. CONTRAST:  ISOVUE-370 IOPAMIDOL (ISOVUE-370) INJECTION 76% COMPARISON:  Chest x-ray 02/06/2018 FINDINGS: Cardiovascular: Satisfactory opacification of the pulmonary arteries to the segmental level. No evidence of pulmonary embolism. Ectatic ascending aorta measuring up to 3.8 cm. Common origin of the right brachiocephalic and left common  carotid artery. Moderate aortic atherosclerosis. No dissection seen. Coronary artery calcification. Borderline heart size. No significant pericardial effusion. Mediastinum/Nodes: Midline trachea. No thyroid mass. No significantly enlarged lymph nodes. Esophagus within normal limits. Small calcified subcarinal lymph node consistent with prior granulomatous disease. Lungs/Pleura: No focal consolidation or effusion. Granuloma in the left lower lobe. Minimal tree-in-bud density peripheral right upper lobe. Upper Abdomen: No acute abnormality. Musculoskeletal: Severe compression fracture at T7, likely chronic. Thoracic stimulator the tip at T8. Review of the MIP images confirms the above findings. IMPRESSION: 1. Negative for acute pulmonary embolus. 2. Minimal tree-in-bud density in the right upper lobe peripherally suggesting bronchiolitis/infection. No focal consolidation 3. Prior granulomatous disease. 4. Severe chronic appearing compression fracture at T7 Aortic Atherosclerosis (ICD10-I70.0). Electronically Signed   By: Jasmine Pang M.D.   On: 02/06/2018 23:08    Assessment and Plan:   1. Chest pain Troponin x 3 negative. EKG with poor R wave progression, no prior to compare CTA negative for PE, but with aortic atherosclerosis noted. BNP normal. Risk factors for ACS include HTN. She takes a daily ASA.  SOB and DOE seem to be her anginal equivalent by history. She has known disease with a prior heart cath in 2006 (awaiting records). Even though her CE have been negative, I think she would benefit from a heart catheterization. I will also order an echo to assess structure and function given her ongoing SOB.   2. HTN Home regimen includes verapamil.    3. CKD Unknown stage, unknown baseline. sCr here is 0.8 with good urine output. Will start IV hydration prior to contrast load with plans to hydrate after procedure.   For questions or updates, please contact [facility] Please consult  [email or web address] for contact info under Cardiology/STEMI.   Signed, Marcelino Duster, [place]  02/07/2018 12:26 PM    Patient seen and examined. Agree with assessment and plan. Yaeli Hartung is a very pleasant female who was born in the [place] of [place] and also has [place] heritage.   She had been  living in [place], [place] until almost 2018 when she moved to [place] to be near her daughter and currently resides at [facility]. .  She suffered an apparent initial TIA 8 years ago and had a second small stroke 3 years ago.  In 2006 she had undergone cardiac catheterization in [place] and was told of having blockages but did not have any intervention.  Recently, she has begun to notice more shortness of breath with activity.  She also recently had been awakened with shortness of breath.  Over the past several days she has noticed a tightness in her chest which she described like a belt squeezing.  She felt the symptoms were of similar character to her prior symptoms that she experienced before  her catheterization. She presented to the hospital yesterday.  Initial enzymes are negative.her ECG shows sinus rhythm at 83 with poor anterior R-wave progression and is without acute ST-T abnormalities. On exam, she is very pleasant in no acute distress.  She states that she has lost approximately 16 pounds over the past half year.  There is no JVD.  Carotids are equal.  She is mildly kyphotic.  Lungs are clear.  Rhythm is regular with a 1-2/6 systolic murmur in the aortic region and lower sternal border; .  Abdomen is nontender.  Pulses were 2+.  She did not have significant edema.  Neurologic exam is grossly nonfocal.  A CT has shown aortic atherosclerosis there is a severe compression fracture at T7. I had a long discussion with her regarding her change in symptomatology.  With her previous documentation of CAD, exertional dyspnea and even an episode at rest with recent episode of chest tightness.  I have  recommended definitive cardiac catheterization.  The patient is aware of this procedure since she had this done before. I have reviewed the risks, indications, and alternatives to cardiac catheterization, possible angioplasty, and stenting with the patient. Risks include but are not limited to bleeding, infection, vascular injury, stroke, myocardial infection, arrhythmia, kidney injury, radiation-related injury in the case of prolonged fluoroscopy use, emergency cardiac surgery, and death. The patient understands the risks of serious complication is 1-2 in 1000 with diagnostic cardiac cath and 1-2% or less with angioplasty/stenting.  Plan to perform cardiac catheterization later today as  schedule allows.   Lennette Biharihomas A. Bryanah Sidell, MD, [facility] 02/07/2018 1:44 PM

## 2018-02-07 NOTE — H&P (View-Only) (Signed)
Cardiology Consultation:   Patient ID: [name]; [id number]; Apr 08, 1930   Admit date: 02/06/2018 Date of Consult: 02/07/2018  Primary Care Provider: Adrian Prince, MD Primary Cardiologist: new - Dr. Tresa Endo Primary Electrophysiologist:     Patient Profile:   Jill Mcdonald is a 84 y.o. female with a hx of HTN, TIA x 2, and ?CKD who is being seen today for the evaluation of chest pain at the request of Dr. Malachi Bonds.  History of Present Illness:   Jill Mcdonald was a resident of [place], [place] and recently moved here last fall. She lives in an assisted living center and has a daughter in the area. She had a cardiologist and a nephrologist in [place]. She had a heart cath in Jan/2006 and was told she had blockages, but no stents placed. (Awaiting records).   It sounds as though her anginal equivalent is shortness of breath. She had SOB and DOE prior to her cath in 2006. She never had chest pain. She states that she started getting SOB again last August 2018. She did not see her cardiologist for this because she knew she was relocating to [facility]. Her SOB and DOE have gotten much worse over the past several weeks. Two days ago, she also noticed a new chest pain in her central inferior sternal area. The chest pain has been constant for the past 2 days. She denies orthopnea, but can't lay flat because of back pain. She denies lower extremity swelling, palpitations, dizziness, and pre/syncope. Yesterday, she was too weak to even get off the cough and EMS was dispatched to her room. She also reports a significant weight loss over the past several weeks. She is very active and independent on most ADLs. She has not fallen in the past 3 years.  On arrival to the ER, initial troponin was negative. EKG with poor R wave progression, no prior for comparison. She appears to be euvolemic, maybe even a bit dehydrated.    Past Medical History:  Diagnosis Date  . CKD (chronic kidney disease)   . TIA (transient ischemic  attack)     History reviewed. No pertinent surgical history.   Home Medications:  Prior to Admission medications   Medication Sig Start Date End Date Taking? Authorizing Provider  aspirin 325 MG tablet Take 325 mg by mouth daily.   Yes [provider]  gabapentin (NEURONTIN) 300 MG capsule Take 600 mg by mouth 2 (two) times daily.   Yes [provider]  ranitidine (ZANTAC) 150 MG tablet Take 150 mg by mouth daily as needed for heartburn.   Yes [provider]  traMADol (ULTRAM) 50 MG tablet Take 50 mg by mouth every 6 (six) hours as needed for moderate pain.   Yes [provider]  verapamil (CALAN) 120 MG tablet Take 120 mg by mouth at bedtime.   Yes [provider]  verapamil (VERELAN PM) 240 MG 24 hr capsule Take 240 mg by mouth daily.   Yes [provider]  zolpidem (AMBIEN) 5 MG tablet Take 5 mg by mouth at bedtime as needed for sleep.   Yes [provider]    Inpatient Medications: Scheduled Meds: . aspirin  325 mg Oral Daily  . enoxaparin (LOVENOX) injection  40 mg Subcutaneous Q24H  . gabapentin  600 mg Oral Daily  . gabapentin  900 mg Oral QHS  . verapamil  120 mg Oral QHS  . verapamil  240 mg Oral Daily   Continuous Infusions:  PRN Meds:  acetaminophen, morphine injection, nitroGLYCERIN, ondansetron (ZOFRAN) IV  Allergies:    Allergies  Allergen Reactions  . Other     Patient states she is allergic to 36 things but does not remember the name.     Social History:   Social History   Socioeconomic History  . Marital status: Single    Spouse name: Not on file  . Number of children: Not on file  . Years of education: Not on file  . Highest education level: Not on file  Social Needs  . Financial resource strain: Not on file  . Food insecurity - worry: Not on file  . Food insecurity - inability: Not on file  . Transportation needs - medical: Not on file  . Transportation needs - non-medical: Not on  file  Occupational History  . Not on file  Tobacco Use  . Smoking status: Never Smoker  . Smokeless tobacco: Never Used  Substance and Sexual Activity  . Alcohol use: No    Frequency: Never  . Drug use: No  . Sexual activity: No  Other Topics Concern  . Not on file  Social History Narrative  . Not on file    Family History:    Family History  Problem Relation Age of Onset  . Hypertension Father      ROS:  Please see the history of present illness.   All other ROS reviewed and negative.     Physical Exam/Data:   Vitals:   02/07/18 0645 02/07/18 0700 02/07/18 0715 02/07/18 0800  BP: 120/73 120/75 117/72 114/69  Pulse: 73 73 75 73  Resp: 15 15 14 16   SpO2: 97% 96% 97% 98%  Weight:      Height:        Intake/Output Summary (Last 24 hours) at 02/07/2018 1226 Last data filed at 02/07/2018 1143 Gross per 24 hour  Intake 1000 ml  Output -  Net 1000 ml   Filed Weights   02/06/18 1751  Weight: 102 lb (46.3 kg)   Body mass index is 19.27 kg/m.  General:  Well nourished, well developed, in no acute distress HEENT: normal Neck: no JVD Vascular: No carotid bruits  Cardiac:  normal S1, S2; RRR; no murmur Lungs:  clear to auscultation bilaterally, no wheezing, rhonchi or rales, diminished in bases Abd: soft, nontender, no hepatomegaly  Ext: no edema Musculoskeletal:  No deformities, BUE and BLE strength normal and equal Skin: warm and dry  Neuro:  CNs 2-12 intact, no focal abnormalities noted Psych:  Normal affect   EKG:  The EKG was personally reviewed and demonstrates:  Sinus, poor R wave progression Telemetry:  Telemetry was personally reviewed and demonstrates:  sinus  Relevant CV Studies:  Awaiting records  Laboratory Data:  Chemistry Recent Labs  Lab 02/06/18 1820  NA 140  K 5.1  CL 110  CO2 20*  GLUCOSE 79  BUN 13  CREATININE 0.80  CALCIUM 8.0*  GFRNONAA >60  GFRAA >60  ANIONGAP 10    Recent Labs  Lab 02/06/18 1820  PROT 5.3*  ALBUMIN  3.2*  AST 40  ALT 17  ALKPHOS 86  BILITOT 1.3*   Hematology Recent Labs  Lab 02/06/18 1803  WBC 4.4  RBC 3.47*  HGB 11.3*  HCT 33.5*  MCV 96.5  MCH 32.6  MCHC 33.7  RDW 14.1  PLT 350   Cardiac Enzymes Recent Labs  Lab 02/06/18 1820 02/06/18 2315 02/07/18 0441  TROPONINI <0.03 <0.03 <0.03    Recent Labs  Lab 02/06/18 1813  TROPIPOC 0.01    BNP Recent Labs  Lab 02/06/18 1820  BNP 58.0    DDimer No results for input(s): DDIMER in the last 168 hours.   Lipid Panel  No results found for: CHOL, TRIG, HDL, CHOLHDL, VLDL, LDLCALC, LDLDIRECT  Radiology/Studies:  Dg Chest 2 View  Result Date: 02/06/2018 CLINICAL DATA:  84 y/o F; 1 day of central chest pain and shortness of breath. EXAM: CHEST - 2 VIEW COMPARISON:  None. FINDINGS: Midthoracic anterior compression deformity with 60% loss of height, probably T7. Bones are otherwise unremarkable. Cardiomediastinal silhouette within normal limits given projection and technique. Calcific aortic atherosclerosis. Clear lungs. IMPRESSION: No acute pulmonary process identified. T7 moderate to severe anterior compression deformity, age indeterminate. Electronically Signed   By: Mitzi Hansen M.D.   On: 02/06/2018 18:33   Ct Angio Chest Pe W Or Wo Contrast  Result Date: 02/06/2018 CLINICAL DATA:  Short of breath EXAM: CT ANGIOGRAPHY CHEST WITH CONTRAST TECHNIQUE: Multidetector CT imaging of the chest was performed using the standard protocol during bolus administration of intravenous contrast. Multiplanar CT image reconstructions and MIPs were obtained to evaluate the vascular anatomy. CONTRAST:  ISOVUE-370 IOPAMIDOL (ISOVUE-370) INJECTION 76% COMPARISON:  Chest x-ray 02/06/2018 FINDINGS: Cardiovascular: Satisfactory opacification of the pulmonary arteries to the segmental level. No evidence of pulmonary embolism. Ectatic ascending aorta measuring up to 3.8 cm. Common origin of the right brachiocephalic and left common  carotid artery. Moderate aortic atherosclerosis. No dissection seen. Coronary artery calcification. Borderline heart size. No significant pericardial effusion. Mediastinum/Nodes: Midline trachea. No thyroid mass. No significantly enlarged lymph nodes. Esophagus within normal limits. Small calcified subcarinal lymph node consistent with prior granulomatous disease. Lungs/Pleura: No focal consolidation or effusion. Granuloma in the left lower lobe. Minimal tree-in-bud density peripheral right upper lobe. Upper Abdomen: No acute abnormality. Musculoskeletal: Severe compression fracture at T7, likely chronic. Thoracic stimulator the tip at T8. Review of the MIP images confirms the above findings. IMPRESSION: 1. Negative for acute pulmonary embolus. 2. Minimal tree-in-bud density in the right upper lobe peripherally suggesting bronchiolitis/infection. No focal consolidation 3. Prior granulomatous disease. 4. Severe chronic appearing compression fracture at T7 Aortic Atherosclerosis (ICD10-I70.0). Electronically Signed   By: Jasmine Pang M.D.   On: 02/06/2018 23:08    Assessment and Plan:   1. Chest pain Troponin x 3 negative. EKG with poor R wave progression, no prior to compare CTA negative for PE, but with aortic atherosclerosis noted. BNP normal. Risk factors for ACS include HTN. She takes a daily ASA.  SOB and DOE seem to be her anginal equivalent by history. She has known disease with a prior heart cath in 2006 (awaiting records). Even though her CE have been negative, I think she would benefit from a heart catheterization. I will also order an echo to assess structure and function given her ongoing SOB.   2. HTN Home regimen includes verapamil.    3. CKD Unknown stage, unknown baseline. sCr here is 0.8 with good urine output. Will start IV hydration prior to contrast load with plans to hydrate after procedure.   For questions or updates, please contact [facility] Please consult  [email or web address] for contact info under Cardiology/STEMI.   Signed, Marcelino Duster, [place]  02/07/2018 12:26 PM    Patient seen and examined. Agree with assessment and plan. Jill Mcdonald is a very pleasant female who was born in the [place] of [place] and also has [place] heritage.   She had been  living in [place], [place] until almost 2018 when she moved to [place] to be near her daughter and currently resides at [facility]. .  She suffered an apparent initial TIA 8 years ago and had a second small stroke 3 years ago.  In 2006 she had undergone cardiac catheterization in [place] and was told of having blockages but did not have any intervention.  Recently, she has begun to notice more shortness of breath with activity.  She also recently had been awakened with shortness of breath.  Over the past several days she has noticed a tightness in her chest which she described like a belt squeezing.  She felt the symptoms were of similar character to her prior symptoms that she experienced before  her catheterization. She presented to the hospital yesterday.  Initial enzymes are negative.her ECG shows sinus rhythm at 83 with poor anterior R-wave progression and is without acute ST-T abnormalities. On exam, she is very pleasant in no acute distress.  She states that she has lost approximately 16 pounds over the past half year.  There is no JVD.  Carotids are equal.  She is mildly kyphotic.  Lungs are clear.  Rhythm is regular with a 1-2/6 systolic murmur in the aortic region and lower sternal border; .  Abdomen is nontender.  Pulses were 2+.  She did not have significant edema.  Neurologic exam is grossly nonfocal.  A CT has shown aortic atherosclerosis there is a severe compression fracture at T7. I had a long discussion with her regarding her change in symptomatology.  With her previous documentation of CAD, exertional dyspnea and even an episode at rest with recent episode of chest tightness.  I have  recommended definitive cardiac catheterization.  The patient is aware of this procedure since she had this done before. I have reviewed the risks, indications, and alternatives to cardiac catheterization, possible angioplasty, and stenting with the patient. Risks include but are not limited to bleeding, infection, vascular injury, stroke, myocardial infection, arrhythmia, kidney injury, radiation-related injury in the case of prolonged fluoroscopy use, emergency cardiac surgery, and death. The patient understands the risks of serious complication is 1-2 in 1000 with diagnostic cardiac cath and 1-2% or less with angioplasty/stenting.  Plan to perform cardiac catheterization later today as  schedule allows.   Lennette Biharihomas A. Kelly, MD, [facility] 02/07/2018 1:44 PM

## 2018-02-07 NOTE — ED Notes (Signed)
Ambulated to restroom without assistance, no distress noted.

## 2018-02-07 NOTE — Interval H&P Note (Signed)
History and Physical Interval Note:  02/07/2018 3:16 PM  Silvio PateShelia Barbarann EhlersM Randhawa  has presented today for surgery, with the diagnosis of cp  The various methods of treatment have been discussed with the patient and family. After consideration of risks, benefits and other options for treatment, the patient has consented to  Procedure(s): LEFT HEART CATH AND CORONARY ANGIOGRAPHY (N/A) and possible coronary angioplasty as a surgical intervention .  The patient's history has been reviewed, patient examined, no change in status, stable for surgery.  I have reviewed the patient's chart and labs.  Questions were answered to the patient's satisfaction.     Calla Wedekind

## 2018-02-07 NOTE — Progress Notes (Signed)
PROGRESS NOTE  Jill Mcdonald  ZOX:[id number] DOB: Apr 26, 1930 DOA: 02/06/2018 PCP: Jill Prince, MD  Brief Narrative:   The patient is an 84 year old female with history of TIA, hypertension who presents with a several week history of progressively worsening exertional chest pain with associated shortness of breath.  Her chest pain did not radiate to her back or neck or arms but has been limiting her ability to do her daily activities.  It occurred at rest which concerned her and she presented to the emergency department.  She tried taking nitroglycerin which made her feel even worse.  Her initial troponin was negative and her EKG demonstrated flattening of the T waves in the anterior leads.  Her CT Angio of the chest was negative for PE but did demonstrate some evidence of coronary artery disease.  Cardiology consult was pending at the time of my visit this morning.  She underwent cardiac catheterization on 3/8 which demonstrated mild to moderate nonobstructive CAD.  Her ejection fraction was 60-65%.  Echocardiogram is pending.  It is likely that her pain is noncardiac and may get better on its own.  Alternatively, her CT demonstrated minimal tree-in-bud density in the right upper lobe suggesting bronchiolitis/infection.  The patient denies fevers, chills, productive cough at this time however.  Assessment & Plan:  Chest pain, does not appear to be related to a pulmonary embolism or to coronary artery disease.  I am wondering if her complex compression fracture at T7 may be contributing to the sensation of chest pain and shortness of breath.  Her symptoms do not sound typical for GERD or gastritis. -  Appreciate cardiology assistance -  Monitor post-cath on tele -  Start famotidine -  Continue gabapentin -  Anticipate discharge tomorrow  Hypertension, blood pressure moderately controlled -  Continue verapamil  TIA, continue aspirin, patient no longer on statin   DVT prophylaxis:   lovenox Code Status:  full Family Communication:  Patient alone Disposition Plan:  Home tomorrow    Consultants:   Cardiology  Procedures:  3/8 LHC  Antimicrobials:  Anti-infectives (From admission, onward)   None       Subjective:  Currently still having some mild substernal CP and SOB.  Denies nausea, diaphoresis.    Objective: Vitals:   02/07/18 1538 02/07/18 1543 02/07/18 1548 02/07/18 1553  BP: (!) 163/90 (!) 143/86 (!) 156/87   Pulse: 79 79 79 (!) 0  Resp: (!) 8 (!) 8 12 16   Temp:      TempSrc:      SpO2: 100% 100% (!) 0% (!) 0%  Weight:      Height:        Intake/Output Summary (Last 24 hours) at 02/07/2018 1743 Last data filed at 02/07/2018 1600 Gross per 24 hour  Intake 1312.5 ml  Output -  Net 1312.5 ml   Filed Weights   02/06/18 1751 02/07/18 1219  Weight: 46.3 kg (102 lb) 41.1 kg (90 lb 8 oz)    Examination:  General exam:  Adult female.  No acute distress.  HEENT:  NCAT, MMM Respiratory system: Clear to auscultation bilaterally Cardiovascular system: Regular rate and rhythm, normal S1/S2. No murmurs, rubs, gallops or clicks.  Warm extremities Gastrointestinal system: Normal active bowel sounds, soft, nondistended, nontender. MSK:  Normal tone and bulk, no lower extremity edema Neuro:  Grossly intact    Data Reviewed: I have personally reviewed following labs and imaging studies  CBC: Recent Labs  Lab 02/06/18 1803  WBC 4.4  HGB 11.3*  HCT 33.5*  MCV 96.5  PLT 350   Basic Metabolic Panel: Recent Labs  Lab 02/06/18 1820  NA 140  K 5.1  CL 110  CO2 20*  GLUCOSE 79  BUN 13  CREATININE 0.80  CALCIUM 8.0*   GFR: Estimated Creatinine Clearance: 33.4 mL/min (by C-G formula based on SCr of 0.8 mg/dL). Liver Function Tests: Recent Labs  Lab 02/06/18 1820  AST 40  ALT 17  ALKPHOS 86  BILITOT 1.3*  PROT 5.3*  ALBUMIN 3.2*   Recent Labs  Lab 02/06/18 1820  LIPASE 32   No results for input(s): AMMONIA in the last 168  hours. Coagulation Profile: No results for input(s): INR, PROTIME in the last 168 hours. Cardiac Enzymes: Recent Labs  Lab 02/06/18 1820 02/06/18 2315 02/07/18 0441 02/07/18 1329  TROPONINI <0.03 <0.03 <0.03 <0.03   BNP (last 3 results) No results for input(s): PROBNP in the last 8760 hours. HbA1C: No results for input(s): HGBA1C in the last 72 hours. CBG: No results for input(s): GLUCAP in the last 168 hours. Lipid Profile: No results for input(s): CHOL, HDL, LDLCALC, TRIG, CHOLHDL, LDLDIRECT in the last 72 hours. Thyroid Function Tests: No results for input(s): TSH, T4TOTAL, FREET4, T3FREE, THYROIDAB in the last 72 hours. Anemia Panel: No results for input(s): VITAMINB12, FOLATE, FERRITIN, TIBC, IRON, RETICCTPCT in the last 72 hours. Urine analysis: No results found for: COLORURINE, APPEARANCEUR, LABSPEC, PHURINE, GLUCOSEU, HGBUR, BILIRUBINUR, KETONESUR, PROTEINUR, UROBILINOGEN, NITRITE, LEUKOCYTESUR Sepsis Labs: @LABRCNTIP (procalcitonin:4,lacticidven:4)  )No results found for this or any previous visit (from the past 240 hour(s)).    Radiology Studies: Dg Chest 2 View  Result Date: 02/06/2018 CLINICAL DATA:  84 y/o F; 1 day of central chest pain and shortness of breath. EXAM: CHEST - 2 VIEW COMPARISON:  None. FINDINGS: Midthoracic anterior compression deformity with 60% loss of height, probably T7. Bones are otherwise unremarkable. Cardiomediastinal silhouette within normal limits given projection and technique. Calcific aortic atherosclerosis. Clear lungs. IMPRESSION: No acute pulmonary process identified. T7 moderate to severe anterior compression deformity, age indeterminate. Electronically Signed   By: Mitzi HansenLance  Furusawa-Stratton M.D.   On: 02/06/2018 18:33   Ct Angio Chest Pe W Or Wo Contrast  Result Date: 02/06/2018 CLINICAL DATA:  Jill Mcdonald of breath EXAM: CT ANGIOGRAPHY CHEST WITH CONTRAST TECHNIQUE: Multidetector CT imaging of the chest was performed using the standard  protocol during bolus administration of intravenous contrast. Multiplanar CT image reconstructions and MIPs were obtained to evaluate the vascular anatomy. CONTRAST:  100mL ISOVUE-370 IOPAMIDOL (ISOVUE-370) INJECTION 76% COMPARISON:  Chest x-ray 02/06/2018 FINDINGS: Cardiovascular: Satisfactory opacification of the pulmonary arteries to the segmental level. No evidence of pulmonary embolism. Ectatic ascending aorta measuring up to 3.8 cm. Common origin of the right brachiocephalic and left common carotid artery. Moderate aortic atherosclerosis. No dissection seen. Coronary artery calcification. Borderline heart size. No significant pericardial effusion. Mediastinum/Nodes: Midline trachea. No thyroid mass. No significantly enlarged lymph nodes. Esophagus within normal limits. Small calcified subcarinal lymph node consistent with prior granulomatous disease. Lungs/Pleura: No focal consolidation or effusion. Granuloma in the left lower lobe. Minimal tree-in-bud density peripheral right upper lobe. Upper Abdomen: No acute abnormality. Musculoskeletal: Severe compression fracture at T7, likely chronic. Thoracic stimulator the tip at T8. Review of the MIP images confirms the above findings. IMPRESSION: 1. Negative for acute pulmonary embolus. 2. Minimal tree-in-bud density in the right upper lobe peripherally suggesting bronchiolitis/infection. No focal consolidation 3. Prior granulomatous disease. 4. Severe chronic appearing compression fracture at T7 Aortic Atherosclerosis (  ICD10-I70.0). Electronically Signed   By: Jasmine Pang M.D.   On: 02/06/2018 23:08     Scheduled Meds: . aspirin  325 mg Oral Daily  . enoxaparin (LOVENOX) injection  40 mg Subcutaneous Q24H  . [START ON 02/08/2018] enoxaparin (LOVENOX) injection  40 mg Subcutaneous Q24H  . gabapentin  600 mg Oral Daily  . gabapentin  900 mg Oral QHS  . sodium chloride flush  3 mL Intravenous Q12H  . verapamil  120 mg Oral QHS  . [START ON 02/08/2018]  verapamil  240 mg Oral Daily   Continuous Infusions: . sodium chloride Stopped (02/07/18 1736)  . sodium chloride 50 mL/hr at 02/07/18 1737  . sodium chloride       LOS: 0 days    Time spent: 30 min    Renae Fickle, MD [facility] Pager [phone]  If 7PM-7AM, please contact night-coverage [email or web address] Password [facility] 02/07/2018, 5:43 PM

## 2018-02-07 NOTE — Progress Notes (Signed)
Attempted to do echo but patient is being sent to the cath lab

## 2018-02-08 ENCOUNTER — Other Ambulatory Visit: Payer: Self-pay

## 2018-02-08 ENCOUNTER — Encounter (HOSPITAL_COMMUNITY): Payer: Self-pay

## 2018-02-08 ENCOUNTER — Observation Stay (HOSPITAL_COMMUNITY): Payer: Medicare Other

## 2018-02-08 DIAGNOSIS — K219 Gastro-esophageal reflux disease without esophagitis: Secondary | ICD-10-CM | POA: Diagnosis not present

## 2018-02-08 DIAGNOSIS — R0789 Other chest pain: Secondary | ICD-10-CM | POA: Diagnosis not present

## 2018-02-08 DIAGNOSIS — I10 Essential (primary) hypertension: Secondary | ICD-10-CM | POA: Diagnosis not present

## 2018-02-08 MED ORDER — OMEPRAZOLE 40 MG PO CPDR: 40.0000 mg | DELAYED_RELEASE_CAPSULE | Freq: Every day | ORAL | 0 refills | Status: DC

## 2018-02-08 MED ORDER — ENSURE ENLIVE PO LIQD: 237.0000 mL | Freq: Two times a day (BID) | ORAL | 0 refills | Status: AC

## 2018-02-08 NOTE — Discharge Instructions (Signed)

## 2018-02-08 NOTE — Discharge Summary (Signed)
Physician Discharge Summary  Jill Mcdonald WJX:[id number] DOB: 1930-06-20 DOA: 02/06/2018  PCP: Adrian Prince, MD  Admit date: 02/06/2018 Discharge date: 02/08/2018  Admitted From: home  Disposition:  home  Recommendations for Outpatient Follow-up:  1. Follow up with PCP in 1-2 weeks for chest pain/GERD.   2. Omeprazole daily x 4 weeks  Home Health:  none  Equipment/Devices:  none  Discharge Condition:  Stable, improved CODE STATUS:  Full code  Diet recommendation:  Healthy heart   Brief/Interim Summary:  The patient is an 84 year old female with history of TIA, hypertension who presents with a several week history of progressively worsening exertional chest pain with associated shortness of breath.  Her chest pain did not radiate to her back or neck or arms but has been limiting her ability to do her daily activities.  It occurred at rest which concerned her and she presented to the emergency department.  She tried taking nitroglycerin which made her feel even worse.  Her initial troponin was negative and her EKG demonstrated flattening of the T waves in the anterior leads.  Her CT Angio of the chest was negative for PE but did demonstrate some evidence of coronary artery disease.  Cardiology was consulted and she underwent left cardiac catheterization on 3/8 which demonstrated mild to moderate nonobstructive CAD.  Her ejection fraction was 60-65% by cath.  It is likely that her pain is noncardiac.  Although her CT demonstrated minimal tree-in-bud density in the right upper lobe suggesting bronchiolitis/infection, the patient denies fevers, chills, productive cough.  She states she has had a long history of severe acid reflux and she has a hiatal hernia which sometimes bothers her.  I suspect that her discomfort may be related to some gastritis or GERD and have given her prescription for omeprazole to try for the next few weeks.  She should follow-up with her primary care doctor to discuss her  symptoms to see if they are getting better on PPI.  Discharge Diagnoses:  Principal Problem:   GERD (gastroesophageal reflux disease) Active Problems:   Chest pain, rule out acute myocardial infarction   HTN (hypertension)  Gastritis/GERD resulting in chest pain.  She also has a complex compression fracture at T7 that may be contributing to the sensation of chest pain and shortness of breath, but this is less likely. -  Appreciate cardiology assistance -  Tele:  NSR -  Cath 3/8:  Nonobstructive CAD -  Continue gabapentin -  Start omeprazole -  F/u with PCP in 2 weeks  -  Defer to cardiology as an outpatient if they feel an ECHO may be useful  Nonobstructive CAD -  Continue aspirin, CCB -  No longer on statin due to age and frailty  Hypertension, blood pressure moderately controlled -  Continue verapamil  TIA, continue aspirin, patient no longer on statin    Discharge Instructions  Discharge Instructions    Call MD for:  difficulty breathing, headache or visual disturbances   Complete by:  As directed    Call MD for:  persistant dizziness or light-headedness   Complete by:  As directed    Call MD for:  persistant nausea and vomiting   Complete by:  As directed    Call MD for:  severe uncontrolled pain   Complete by:  As directed    Diet general   Complete by:  As directed    Increase activity slowly   Complete by:  As directed  Allergies as of 02/08/2018      Reactions   Other    Patient states she is allergic to 36 things but does not remember the name.       Medication List    TAKE these medications   aspirin 325 MG tablet Take 325 mg by mouth daily.   feeding supplement (ENSURE ENLIVE) Liqd Take 237 mLs by mouth 2 (two) times daily between meals.   gabapentin 300 MG capsule Commonly known as:  NEURONTIN Take 600 mg by mouth 2 (two) times daily.   omeprazole 40 MG capsule Commonly known as:  PRILOSEC Take 1 capsule (40 mg total) by mouth daily.    ranitidine 150 MG tablet Commonly known as:  ZANTAC Take 150 mg by mouth daily as needed for heartburn.   traMADol 50 MG tablet Commonly known as:  ULTRAM Take 50 mg by mouth every 6 (six) hours as needed for moderate pain.   verapamil 240 MG 24 hr capsule Commonly known as:  VERELAN PM Take 240 mg by mouth daily.   verapamil 120 MG tablet Commonly known as:  CALAN Take 120 mg by mouth at bedtime.   zolpidem 5 MG tablet Commonly known as:  AMBIEN Take 5 mg by mouth at bedtime as needed for sleep.      Follow-up Information    Adrian PrinceSouth, Stephen, MD. Schedule an appointment as soon as possible for a visit in 2 week(s).   Specialty:  Endocrinology Contact information: [address] [place] [address] [phone]        Lennette BihariKelly, Thomas A, MD .   Specialty:  Cardiology Contact information: [address] [place] [address] [phone]          Allergies  Allergen Reactions  . Other     Patient states she is allergic to 36 things but does not remember the name.     Consultations: [facility]   Procedures/Studies: Dg Chest 2 View  Result Date: 02/06/2018 CLINICAL DATA:  84 y/o F; 1 day of central chest pain and shortness of breath. EXAM: CHEST - 2 VIEW COMPARISON:  None. FINDINGS: Midthoracic anterior compression deformity with 60% loss of height, probably T7. Bones are otherwise unremarkable. Cardiomediastinal silhouette within normal limits given projection and technique. Calcific aortic atherosclerosis. Clear lungs. IMPRESSION: No acute pulmonary process identified. T7 moderate to severe anterior compression deformity, age indeterminate. Electronically Signed   By: Mitzi HansenLance  Furusawa-Stratton M.D.   On: 02/06/2018 18:33   Ct Angio Chest Pe W Or Wo Contrast  Result Date: 02/06/2018 CLINICAL DATA:  Sahan Pen of breath EXAM: CT ANGIOGRAPHY CHEST WITH CONTRAST TECHNIQUE: Multidetector CT imaging of the chest was performed using the standard protocol  during bolus administration of intravenous contrast. Multiplanar CT image reconstructions and MIPs were obtained to evaluate the vascular anatomy. CONTRAST:  100mL ISOVUE-370 IOPAMIDOL (ISOVUE-370) INJECTION 76% COMPARISON:  Chest x-ray 02/06/2018 FINDINGS: Cardiovascular: Satisfactory opacification of the pulmonary arteries to the segmental level. No evidence of pulmonary embolism. Ectatic ascending aorta measuring up to 3.8 cm. Common origin of the right brachiocephalic and left common carotid artery. Moderate aortic atherosclerosis. No dissection seen. Coronary artery calcification. Borderline heart size. No significant pericardial effusion. Mediastinum/Nodes: Midline trachea. No thyroid mass. No significantly enlarged lymph nodes. Esophagus within normal limits. Small calcified subcarinal lymph node consistent with prior granulomatous disease. Lungs/Pleura: No focal consolidation or effusion. Granuloma in the left lower lobe. Minimal tree-in-bud density peripheral right upper lobe. Upper Abdomen: No acute abnormality. Musculoskeletal: Severe compression  fracture at T7, likely chronic. Thoracic stimulator the tip at T8. Review of the MIP images confirms the above findings. IMPRESSION: 1. Negative for acute pulmonary embolus. 2. Minimal tree-in-bud density in the right upper lobe peripherally suggesting bronchiolitis/infection. No focal consolidation 3. Prior granulomatous disease. 4. Severe chronic appearing compression fracture at T7 Aortic Atherosclerosis (ICD10-I70.0). Electronically Signed   By: Jasmine Pang M.D.   On: 02/06/2018 23:08    Subjective:  Denies chest pains and SOB.  Has heartburn frequently and usually takes ranitidine prn.  Occasionally has sensation of food getting stuck in her chest but this resolves quickly.    Discharge Exam: Vitals:   02/08/18 0441 02/08/18 0927  BP: (!) 145/79 (!) 182/97  Pulse: 90   Resp: 18   Temp: 98.1 F (36.7 C)   SpO2: 96%    Vitals:   02/07/18  1700 02/07/18 2001 02/08/18 0441 02/08/18 0927  BP: 127/75 127/80 (!) 145/79 (!) 182/97  Pulse: 75 97 90   Resp:  18 18   Temp:  98.1 F (36.7 C) 98.1 F (36.7 C)   TempSrc:  Oral Oral   SpO2: 90%  96%   Weight:   41.3 kg (91 lb 1.6 oz)   Height:        General: Thin female, mildly kyphotic, alert, awake, not in acute distress Cardiovascular: RRR, S1/S2 +, no rubs, no gallops Respiratory: CTA bilaterally, no wheezing, no rhonchi Abdominal: Soft, NT, ND, bowel sounds + Extremities: no edema, no cyanosis   The results of significant diagnostics from this hospitalization (including imaging, microbiology, ancillary and laboratory) are listed below for reference.     Microbiology: No results found for this or any previous visit (from the past 240 hour(s)).   Labs: BNP (last 3 results) Recent Labs    02/06/18 1820  BNP 58.0   Basic Metabolic Panel: Recent Labs  Lab 02/06/18 1820  NA 140  K 5.1  CL 110  CO2 20*  GLUCOSE 79  BUN 13  CREATININE 0.80  CALCIUM 8.0*   Liver Function Tests: Recent Labs  Lab 02/06/18 1820  AST 40  ALT 17  ALKPHOS 86  BILITOT 1.3*  PROT 5.3*  ALBUMIN 3.2*   Recent Labs  Lab 02/06/18 1820  LIPASE 32   No results for input(s): AMMONIA in the last 168 hours. CBC: Recent Labs  Lab 02/06/18 1803  WBC 4.4  HGB 11.3*  HCT 33.5*  MCV 96.5  PLT 350   Cardiac Enzymes: Recent Labs  Lab 02/06/18 1820 02/06/18 2315 02/07/18 0441 02/07/18 1329  TROPONINI <0.03 <0.03 <0.03 <0.03   BNP: Invalid input(s): POCBNP CBG: No results for input(s): GLUCAP in the last 168 hours. D-Dimer No results for input(s): DDIMER in the last 72 hours. Hgb A1c No results for input(s): HGBA1C in the last 72 hours. Lipid Profile No results for input(s): CHOL, HDL, LDLCALC, TRIG, CHOLHDL, LDLDIRECT in the last 72 hours. Thyroid function studies No results for input(s): TSH, T4TOTAL, T3FREE, THYROIDAB in the last 72 hours.  Invalid input(s):  FREET3 Anemia work up No results for input(s): VITAMINB12, FOLATE, FERRITIN, TIBC, IRON, RETICCTPCT in the last 72 hours. Urinalysis No results found for: COLORURINE, APPEARANCEUR, LABSPEC, PHURINE, GLUCOSEU, HGBUR, BILIRUBINUR, KETONESUR, PROTEINUR, UROBILINOGEN, NITRITE, LEUKOCYTESUR Sepsis Labs Invalid input(s): PROCALCITONIN,  WBC,  LACTICIDVEN   Time coordinating discharge: Over 30 minutes  SIGNED:   Renae Fickle, MD  [facility] 02/08/2018, 4:23 PM Pager   If 7PM-7AM, please contact night-coverage [email or web address] Password [email or web address]

## 2018-02-08 NOTE — Progress Notes (Signed)
Patient being discharged.  Dr Malachi BondsShort stated patient does not need echo here in hospital that she can have it if needed at the cardiologists office at follow up.

## 2018-02-10 ENCOUNTER — Encounter (HOSPITAL_COMMUNITY): Payer: Self-pay | Admitting: Internal Medicine

## 2018-02-18 ENCOUNTER — Telehealth: Payer: Self-pay | Admitting: Internal Medicine

## 2018-02-18 NOTE — Telephone Encounter (Signed)
Dr. Marina GoodellPerry (DOD)  Please advise scheduling

## 2018-02-18 NOTE — Telephone Encounter (Signed)
Pt scheduled to see [name] PA 02/26/18@2 :30pm. Please notify pt of appt.

## 2018-02-26 ENCOUNTER — Ambulatory Visit (INDEPENDENT_AMBULATORY_CARE_PROVIDER_SITE_OTHER): Payer: Medicare Other | Admitting: Gastroenterology

## 2018-02-26 ENCOUNTER — Encounter: Payer: Self-pay | Admitting: Gastroenterology

## 2018-02-26 VITALS — BP 124/66 | HR 80 | Ht 61.0 in | Wt 95.4 lb

## 2018-02-26 DIAGNOSIS — K219 Gastro-esophageal reflux disease without esophagitis: Secondary | ICD-10-CM | POA: Diagnosis not present

## 2018-02-26 DIAGNOSIS — R1013 Epigastric pain: Secondary | ICD-10-CM | POA: Diagnosis not present

## 2018-02-26 MED ORDER — PANTOPRAZOLE SODIUM 40 MG PO TBEC: 40.0000 mg | DELAYED_RELEASE_TABLET | Freq: Two times a day (BID) | ORAL | 3 refills | Status: DC

## 2018-02-26 NOTE — Patient Instructions (Signed)
We have sent the following medications to your pharmacy for you to pick up at your convenience: Pantoprazole 40 mg twice a day  

## 2018-02-27 ENCOUNTER — Encounter: Payer: Self-pay | Admitting: Gastroenterology

## 2018-02-27 DIAGNOSIS — R1013 Epigastric pain: Secondary | ICD-10-CM | POA: Insufficient documentation

## 2018-02-27 NOTE — Progress Notes (Signed)
02/27/2018 Jill PateShelia Barbarann EhlersM Mcdonald [id number] [birth year]   HISTORY OF PRESENT ILLNESS:  This is an 84 year old female who is new to our office and has been referred here by Dr. Evlyn KannerSouth for evaluation of atypical chest pain, SOB, epigastric abdominal pain, intermittent dysphagia, and GERD.  She is here today with her daughter and they tell me that her SOB and chest pain began about two months prior to her move to [place].  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  She is very tearful about this today.  Cardiac evaluation during recent hospitalization is negative.  Has had EGD's in the past, but we do not have any records.  The dysphagia is intermittent to solid food.  She is currently on pantoprazole 40 mg daily.  Last colonoscopy was in May 2018 at which time she was found to have one 5 mm polyp that was removed from the ascending colon.  We do not have the pathology on that polyp.  She was also found to have diverticulosis in the sigmoid colon.  She tells me that she was told that she still needs to have colonoscopies yearly.  She has several medication allergies/intolerances.  Says that she is very sensitive to medications.   Past Medical History:  Diagnosis Date  . CKD (chronic kidney disease)   . Colon polyps   . Diverticulosis   . TIA (transient ischemic attack)    Past Surgical History:  Procedure Laterality Date  . LEFT HEART CATH AND CORONARY ANGIOGRAPHY N/A 02/07/2018   Procedure: LEFT HEART CATH AND CORONARY ANGIOGRAPHY;  Surgeon: Dolores PattyBensimhon, Daniel R, MD;  Location: [facility];  Service: Cardiovascular;  Laterality: N/A;    reports that she has never smoked. She has never used smokeless tobacco. She reports that she does not drink alcohol or use drugs. family history includes Hypertension in her father. Allergies  Allergen Reactions  . Nitrofuran Derivatives  Shortness Of Breath, Nausea And Vomiting and Palpitations    Sweating, coldness, weakness in legs, lightheadedness, no appetite  . Zyvox [Linezolid] Rash  . Amlodipine Besylate Hives and Nausea Only    Nightmares   . Amoxicillin Hives  . Buspirone     Dizziness   . Cefotetan Hives  . Cephalosporins Hives and Diarrhea  . Ciprofloxacin   . Cocamide Hives and Diarrhea  . Codeine Diarrhea and Nausea And Vomiting  . Cyclobenzaprine Diarrhea and Nausea And Vomiting  . Diclofenac     Dizziness, blurred vision and ringing in the ears.   . Latex Hives  . Macrobid [Nitrofurantoin Macrocrystal] Nausea And Vomiting  . Metronidazole   . Norvasc [Amlodipine] Hives, Diarrhea and Nausea And Vomiting  . Paroxetine   . Triple Antibiotic [Bacitracin-Neomycin-Polymyxin]   . Valdecoxib   . Zocor [Simvastatin]     Headache, migraine  . Adhesive [Tape] Rash  . Anesthetics, Amide Rash  . Bactrim [Sulfamethoxazole-Trimethoprim] Nausea Only and Rash  . Cefuroxime Axetil Hives, Nausea And Vomiting and Rash    Gi upset   . Celecoxib Hives and Rash  . Clarithromycin Nausea Only and Rash  . Coreg [Carvedilol] Hives and Rash  . Crestor [Rosuvastatin Calcium] Rash  . Doxycycline Diarrhea and Rash  . Lipitor [Atorvastatin Calcium] Hives and Rash    headache   . Neomycin-Bacitracin Zn-Polymyx Hives and Rash  . Neosporin [Neomycin-Polymyxin-Gramicidin] Hives and Rash  . Penicillins Diarrhea, Nausea And Vomiting and  Rash  . Plavix [Clopidogrel] Nausea And Vomiting and Rash  . Prochlorperazine Edisylate Hives and Rash  . Valtrex [Valacyclovir Hcl] Hives, Diarrhea, Nausea And Vomiting and Rash  . Vytorin [Ezetimibe-Simvastatin] Hives, Diarrhea, Nausea And Vomiting and Rash    Muscle pain and cramps  . Zofran [Ondansetron Hcl] Hives and Rash      Outpatient Encounter Medications as of 02/26/2018  Medication Sig  . aspirin 325 MG tablet Take 325 mg by mouth daily.  . Biotin 1000 MCG tablet Take 1,000  mcg by mouth 3 (three) times daily.  . clobetasol ointment (TEMOVATE) 0.05 % Apply 1 application topically as needed.  . dicyclomine (BENTYL) 10 MG capsule Take 10 mg by mouth 2 (two) times daily as needed for spasms.  . diphenhydrAMINE (BENADRYL) 25 mg capsule Take 25 mg by mouth as needed for allergies or sleep.  . feeding supplement, ENSURE ENLIVE, (ENSURE ENLIVE) LIQD Take 237 mLs by mouth 2 (two) times daily between meals.  . gabapentin (NEURONTIN) 300 MG capsule Take 600 mg by mouth 2 (two) times daily.  Marland Kitchen gemfibrozil (LOPID) 600 MG tablet Take 600 mg by mouth 2 (two) times daily before a meal.  . gentamicin ointment (GARAMYCIN) 0.1 % Apply 1 application topically 2 (two) times daily.  Marland Kitchen omeprazole (PRILOSEC) 40 MG capsule Take 1 capsule (40 mg total) by mouth daily.  . pantoprazole (PROTONIX) 40 MG tablet Take 1 tablet (40 mg total) by mouth 2 (two) times daily before a meal.  . ranitidine (ZANTAC) 150 MG tablet Take 150 mg by mouth daily as needed for heartburn.  . Red Yeast Rice 600 MG CAPS Take 600 mg by mouth daily.  Marland Kitchen tiZANidine (ZANAFLEX) 4 MG capsule Take 4 mg by mouth 3 (three) times daily as needed for muscle spasms.  . traMADol (ULTRAM) 50 MG tablet Take 50 mg by mouth every 6 (six) hours as needed for moderate pain.  . verapamil (CALAN) 120 MG tablet Take 120 mg by mouth at bedtime.  . verapamil (VERELAN PM) 240 MG 24 hr capsule Take 240 mg by mouth daily.  . Vitamin D, Ergocalciferol, (DRISDOL) 50000 units CAPS capsule Take 50,000 Units by mouth daily.  Marland Kitchen zolpidem (AMBIEN) 5 MG tablet Take 5 mg by mouth at bedtime as needed for sleep.  . [DISCONTINUED] pantoprazole (PROTONIX) 40 MG tablet Take 40 mg by mouth daily.  . [DISCONTINUED] pantoprazole (PROTONIX) 40 MG tablet Take 1 tablet (40 mg total) by mouth 2 (two) times daily before a meal.   No facility-administered encounter medications on file as of 02/26/2018.      REVIEW OF SYSTEMS  : All other systems reviewed and  negative except where noted in the History of Present Illness.   PHYSICAL EXAM: BP 124/66   Pulse 80   Ht 5\' 1"  (1.549 Jill)   Wt 95 lb 6.4 oz (43.3 kg)   BMI 18.03 kg/Jill  General: Thin and frail white female in no acute distress Head: Normocephalic and atraumatic Eyes:  Sclerae anicteric, conjunctiva pink. Ears: Normal auditory acuity Lungs: Clear throughout to auscultation; no increased WOB. Heart: Regular rate and rhythm; no Jill/R/G. Abdomen: Soft, non-distended.  BS present.  Mild epigastric TTP. Musculoskeletal: Symmetrical with no gross deformities  Skin: No lesions on visible extremities Extremities: No edema  Neurological: Alert oriented x 4, grossly non-focal Psychological:  Alert and cooperative. 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  Cardiac evaluation is negative.  Has had EGD's in the past.  The dysphagia is intermittent and I think c/w esophageal dysmotility.  I am going to have her increase her pantoprazole to twice daily for now.  We discussed esophagram to start, but she would just like to see how she does with the increase dose of medication for now.  Will follow-up in 4 weeks.  **Will request further records from her GI in [place] including colonoscopy pathology report and last EGD report.  **45 minutes were spent with the patient and her daughter, at least 50% of which was spent discussing options for evaluation and treatment.   CC:  Adrian Prince, MD

## 2018-02-28 NOTE — Progress Notes (Signed)
Reviewed and agree with documentation and assessment and plan. K. Veena Kinza Gouveia , MD   

## 2018-03-03 ENCOUNTER — Telehealth: Payer: Self-pay | Admitting: Emergency Medicine

## 2018-03-03 NOTE — Telephone Encounter (Signed)
Received fax from insurance company that patient is taking omeprazole, ranitidine and protonix and do you want her to continue all of these?

## 2018-03-04 NOTE — Telephone Encounter (Signed)
Will you please contact the patient and confirm what she is taking?  When I saw here I believe that she said she was taking pantoprazole, which she was going to increase to BID and I think we talked about the ranitidine as well.  I see that the prilosec was on her med list but we did not discuss it and she did not say that she was taking it.  Thank you,  Jess

## 2018-03-04 NOTE — Telephone Encounter (Signed)
Left message on patients voicemail to call office.   

## 2018-03-05 ENCOUNTER — Other Ambulatory Visit: Payer: Self-pay

## 2018-03-05 MED ORDER — OMEPRAZOLE 40 MG PO CPDR: 40.0000 mg | DELAYED_RELEASE_CAPSULE | Freq: Every day | ORAL | 3 refills | Status: DC

## 2018-03-05 NOTE — Telephone Encounter (Signed)
Spoke with the patient. She would like to stay on Omeprazole 40 mg daily. She will d/c Pantoprazole. She requests a refill to [facility] on Omeprazole. Keep her appointment on 03/26/18.

## 2018-03-05 NOTE — Telephone Encounter (Signed)
For additional observation, when I spoke to the patient, she had a tendency to wander off subject and had to be re-directed several times. There were instances of repeating herself. Of note, she thinks she came to the appointment alone. She become emotional during the conversation. She admits to not having any GI problems prior to moving to [place]. She also states she doesn't like for anyone to laugh when they talk to her.

## 2018-03-05 NOTE — Telephone Encounter (Signed)
Ok.  I don't know what happened here.  The patient and her daughter seemed very pleasant satisfied and receptive to my input at her appt and never indicated that they preferred to see the doctor when her follow-up was made.  And they said her pain started a couple of months prior to her moving here from [place], around September, never once mentioned about having any chronic pain for 70 years.  She followed with GI closely in [place] for colonoscopies, etc so not sure why it was never addressed there.

## 2018-03-05 NOTE — Telephone Encounter (Signed)
Spoke to patient and she informed me that she is taking Omeprazole once a day, pantoprazole and ranitidine. She asked when her follow up is and I informed her it was at the end of April and she stated she does not want to see an APP anymore only the doctor. She wanted to know how long it would be until she sees the doctor, even though she states this is a chronic pain she has had since she was a teenager.   Patient was scheduled with Dr. Lavon PaganiniNandigam as she was supervising the day patient was seen. She does not want to see Shanda BumpsJessica or an APP only Dr. Lavon PaganiniNandigam.

## 2018-03-05 NOTE — Telephone Encounter (Signed)
Please advise her to take either omeprazole or Protonix once a day and ranitidine at bedtime.  She does not need to stay on both omeprazole and Protonix.  Please schedule next available follow-up appointment with me.  Thanks

## 2018-03-13 ENCOUNTER — Ambulatory Visit (HOSPITAL_COMMUNITY)
Admission: RE | Admit: 2018-03-13 | Discharge: 2018-03-13 | Disposition: A | Discharge status: Home / Self Care | Payer: Medicare Other | Source: Ambulatory Visit | Attending: Endocrinology | Admitting: Endocrinology

## 2018-03-13 DIAGNOSIS — M810 Age-related osteoporosis without current pathological fracture: Secondary | ICD-10-CM | POA: Diagnosis present

## 2018-03-13 MED ORDER — DENOSUMAB 60 MG/ML ~~LOC~~ SOLN
60.0000 mg | Freq: Once | SUBCUTANEOUS | Status: AC
  Administered 2018-03-13: 60 mg via SUBCUTANEOUS
  Filled 2018-03-13: qty 1

## 2018-03-13 NOTE — Discharge Instructions (Signed)
Denosumab injection °What is this medicine? °DENOSUMAB (den oh sue mab) slows bone breakdown. Prolia is used to treat osteoporosis in women after menopause and in men. Xgeva is used to treat a high calcium level due to cancer and to prevent bone fractures and other bone problems caused by multiple myeloma or cancer bone metastases. Xgeva is also used to treat giant cell tumor of the bone. °This medicine may be used for other purposes; ask your health care provider or pharmacist if you have questions. °COMMON BRAND NAME(S): Prolia, XGEVA °What should I tell my health care provider before I take this medicine? °They need to know if you have any of these conditions: °-dental disease °-having surgery or tooth extraction °-infection °-kidney disease °-low levels of calcium or Vitamin D in the blood °-malnutrition °-on hemodialysis °-skin conditions or sensitivity °-thyroid or parathyroid disease °-an unusual reaction to denosumab, other medicines, foods, dyes, or preservatives °-pregnant or trying to get pregnant °-breast-feeding °How should I use this medicine? °This medicine is for injection under the skin. It is given by a health care professional in a hospital or clinic setting. °If you are getting Prolia, a special MedGuide will be given to you by the pharmacist with each prescription and refill. Be sure to read this information carefully each time. °For Prolia, talk to your pediatrician regarding the use of this medicine in children. Special care may be needed. For Xgeva, talk to your pediatrician regarding the use of this medicine in children. While this drug may be prescribed for children as young as 13 years for selected conditions, precautions do apply. °Overdosage: If you think you have taken too much of this medicine contact a poison control center or emergency room at once. °NOTE: This medicine is only for you. Do not share this medicine with others. °What if I miss a dose? °It is important not to miss your  dose. Call your doctor or health care professional if you are unable to keep an appointment. °What may interact with this medicine? °Do not take this medicine with any of the following medications: °-other medicines containing denosumab °This medicine may also interact with the following medications: °-medicines that lower your chance of fighting infection °-steroid medicines like prednisone or cortisone °This list may not describe all possible interactions. Give your health care provider a list of all the medicines, herbs, non-prescription drugs, or dietary supplements you use. Also tell them if you smoke, drink alcohol, or use illegal drugs. Some items may interact with your medicine. °What should I watch for while using this medicine? °Visit your doctor or health care professional for regular checks on your progress. Your doctor or health care professional may order blood tests and other tests to see how you are doing. °Call your doctor or health care professional for advice if you get a fever, chills or sore throat, or other symptoms of a cold or flu. Do not treat yourself. This drug may decrease your body's ability to fight infection. Try to avoid being around people who are sick. °You should make sure you get enough calcium and vitamin D while you are taking this medicine, unless your doctor tells you not to. Discuss the foods you eat and the vitamins you take with your health care professional. °See your dentist regularly. Brush and floss your teeth as directed. Before you have any dental work done, tell your dentist you are receiving this medicine. °Do not become pregnant while taking this medicine or for 5 months after stopping   it. Talk with your doctor or health care professional about your birth control options while taking this medicine. Women should inform their doctor if they wish to become pregnant or think they might be pregnant. There is a potential for serious side effects to an unborn child. Talk  to your health care professional or pharmacist for more information. What side effects may I notice from receiving this medicine? Side effects that you should report to your doctor or health care professional as soon as possible: -allergic reactions like skin rash, itching or hives, swelling of the face, lips, or tongue -bone pain -breathing problems -dizziness -jaw pain, especially after dental work -redness, blistering, peeling of the skin -signs and symptoms of infection like fever or chills; cough; sore throat; pain or trouble passing urine -signs of low calcium like fast heartbeat, muscle cramps or muscle pain; pain, tingling, numbness in the hands or feet; seizures -unusual bleeding or bruising -unusually weak or tired Side effects that usually do not require medical attention (report to your doctor or health care professional if they continue or are bothersome): -constipation -diarrhea -headache -joint pain -loss of appetite -muscle pain -runny nose -tiredness -upset stomach This list may not describe all possible side effects. Call your doctor for medical advice about side effects. You may report side effects to FDA at 1-800-FDA-1088. Where should I keep my medicine? This medicine is only given in a clinic, doctor's office, or other health care setting and will not be stored at home. NOTE: This sheet is a summary. It may not cover all possible information. If you have questions about this medicine, talk to your doctor, pharmacist, or health care provider.  2018 Elsevier/Gold Standard (2016-12-11 19:17:21)  

## 2018-03-26 ENCOUNTER — Ambulatory Visit (INDEPENDENT_AMBULATORY_CARE_PROVIDER_SITE_OTHER): Payer: Medicare Other | Admitting: Gastroenterology

## 2018-03-26 ENCOUNTER — Encounter: Payer: Self-pay | Admitting: Gastroenterology

## 2018-03-26 VITALS — BP 130/80 | HR 76 | Ht 61.0 in | Wt 97.8 lb

## 2018-03-26 DIAGNOSIS — R1310 Dysphagia, unspecified: Secondary | ICD-10-CM | POA: Diagnosis not present

## 2018-03-26 DIAGNOSIS — K588 Other irritable bowel syndrome: Secondary | ICD-10-CM

## 2018-03-26 DIAGNOSIS — R1013 Epigastric pain: Secondary | ICD-10-CM

## 2018-03-26 MED ORDER — RANITIDINE HCL 300 MG PO TABS: 300.0000 mg | ORAL_TABLET | Freq: Every day | ORAL | 3 refills | Status: AC

## 2018-03-26 MED ORDER — PANTOPRAZOLE SODIUM 40 MG PO TBEC: 40.0000 mg | DELAYED_RELEASE_TABLET | Freq: Every day | ORAL | 3 refills | Status: DC

## 2018-03-26 NOTE — Patient Instructions (Addendum)
You have been scheduled for an Upper GI Series at [facility]. Your appointment is on 04/21/2018 at 10:30am. Please arrive 15 minutes prior to your test for registration. Make sure not to eat or drink anything after midnight on the night before your test. If you need to reschedule, please call radiology at [phone]. ________________________________________________________________ An upper GI series uses x rays to help diagnose problems of the upper GI tract, which includes the esophagus, stomach, and duodenum. The duodenum is the first part of the small intestine. An upper GI series is conducted by a radiology technologist or a radiologist-a doctor who specializes in x-ray imaging-at a hospital or outpatient center. While sitting or standing in front of an x-ray machine, the patient drinks barium liquid, which is often white and has a chalky consistency and taste. The barium liquid coats the lining of the upper GI tract and makes signs of disease show up more clearly on x rays. X-ray video, called fluoroscopy, is used to view the barium liquid moving through the esophagus, stomach, and duodenum. Additional x rays and fluoroscopy are performed while the patient lies on an x-ray table. To fully coat the upper GI tract with barium liquid, the technologist or radiologist may press on the abdomen or ask the patient to change position. Patients hold still in various positions, allowing the technologist or radiologist to take x rays of the upper GI tract at different angles. If a technologist conducts the upper GI series, a radiologist will later examine the images to look for problems.    This test typically takes about 1 hour to complete.    We will send Ranitidine and Protonix to your pharmacy  [facility] __________________________________________________________________

## 2018-03-26 NOTE — Progress Notes (Signed)
Jill Mcdonald    [id number]    Oct 11, 1930  Primary Care Physician:South, Jeannett Senior, MD  Referring Physician: Adrian Prince, MD [address] [place], [place] [address]  Chief complaint: Dysphagia, epigastric abdominal pain HPI: 84 year old female is here for follow-up visit.  She was last seen by [name] on February 26, 2018.  Patient recently moved to [facility] to be closer to her daughter from [place].  Is accompanied by her daughter for this office visit.  Patient complains of intermittent swelling food dysphagia about once or twice a month, she notices it mostly with bread and meat.  Denies any trouble swallowing liquids or  Medication.  She was taking pantoprazole but was switched to omeprazole after she was discharge from the hospital when she was admitted with atypical chest pain in March 2019.  She feels omeprazole is not as effective as pantoprazole and is having issues with her urination and she wants to switch back to pantoprazole.  Last EGD about 2 or 3 years ago, patient states she has hiatal hernia and also had her esophagus stretched at that time, report is not available during this visit to review She is currently having regular bowel movements with MiraLAX daily Last colonoscopy May 2018 with removal of 5 mm polyp from ascending colon, pathology report not available.  Per patient she was recommended to undergo repeat colonoscopy annually.     Outpatient Encounter Medications as of 03/26/2018  Medication Sig  . aspirin 325 MG tablet Take 325 mg by mouth daily.  . Biotin 1000 MCG tablet Take 1,000 mcg by mouth 3 (three) times daily.  . clobetasol ointment (TEMOVATE) 0.05 % Apply 1 application topically as needed.  . dicyclomine (BENTYL) 10 MG capsule Take 10 mg by mouth 2 (two) times daily as needed for spasms.  . diphenhydrAMINE (BENADRYL) 25 mg capsule Take 25 mg by mouth as needed for allergies or sleep.  Marland Kitchen gabapentin (NEURONTIN) 300 MG capsule  Take 600 mg by mouth 2 (two) times daily.  Marland Kitchen gemfibrozil (LOPID) 600 MG tablet Take 600 mg by mouth 2 (two) times daily before a meal.  . gentamicin ointment (GARAMYCIN) 0.1 % Apply 1 application topically 2 (two) times daily.  Marland Kitchen omeprazole (PRILOSEC) 40 MG capsule Take 1 capsule (40 mg total) by mouth daily.  . ranitidine (ZANTAC) 150 MG tablet Take 150 mg by mouth daily as needed for heartburn.  . Red Yeast Rice 600 MG CAPS Take 600 mg by mouth daily.  Marland Kitchen tiZANidine (ZANAFLEX) 4 MG capsule Take 4 mg by mouth 3 (three) times daily as needed for muscle spasms.  . traMADol (ULTRAM) 50 MG tablet Take 50 mg by mouth every 6 (six) hours as needed for moderate pain.  . verapamil (CALAN) 120 MG tablet Take 120 mg by mouth at bedtime.  . verapamil (VERELAN PM) 240 MG 24 hr capsule Take 240 mg by mouth daily.  . Vitamin D, Ergocalciferol, (DRISDOL) 50000 units CAPS capsule Take 50,000 Units by mouth daily.  Marland Kitchen zolpidem (AMBIEN) 5 MG tablet Take 5 mg by mouth at bedtime as needed for sleep.   No facility-administered encounter medications on file as of 03/26/2018.     Allergies as of 03/26/2018 - Review Complete 03/26/2018  Allergen Reaction Noted  . Nitrofuran derivatives Shortness Of Breath, Nausea And Vomiting, and Palpitations 02/26/2018  . Zyvox [linezolid] Rash 02/26/2018  . Amlodipine besylate Hives and Nausea Only 02/26/2018  . Amoxicillin Hives 02/26/2018  .  Buspirone  02/26/2018  . Cefotetan Hives 02/26/2018  . Cephalosporins Hives and Diarrhea 02/26/2018  . Ciprofloxacin  02/26/2018  . Cocamide Hives and Diarrhea 02/26/2018  . Codeine Diarrhea and Nausea And Vomiting 02/26/2018  . Cyclobenzaprine Diarrhea and Nausea And Vomiting 02/26/2018  . Diclofenac  02/26/2018  . Latex Hives 02/26/2018  . Macrobid [nitrofurantoin macrocrystal] Nausea And Vomiting 02/26/2018  . Metronidazole  02/26/2018  . Norvasc [amlodipine] Hives, Diarrhea, and Nausea And Vomiting 02/26/2018  . Paroxetine   02/26/2018  . Triple antibiotic [bacitracin-neomycin-polymyxin]  02/26/2018  . Valdecoxib  02/26/2018  . Zocor [simvastatin]  02/26/2018  . Adhesive [tape] Rash 02/26/2018  . Anesthetics, amide Rash 02/26/2018  . Bactrim [sulfamethoxazole-trimethoprim] Nausea Only and Rash 02/26/2018  . Cefuroxime axetil Hives, Nausea And Vomiting, and Rash 02/26/2018  . Celecoxib Hives and Rash 02/26/2018  . Clarithromycin Nausea Only and Rash 02/26/2018  . Coreg [carvedilol] Hives and Rash 02/26/2018  . Crestor [rosuvastatin calcium] Rash 02/26/2018  . Doxycycline Diarrhea and Rash 02/26/2018  . Lipitor [atorvastatin calcium] Hives and Rash 02/26/2018  . Neomycin-bacitracin zn-polymyx Hives and Rash 02/26/2018  . Neosporin [neomycin-polymyxin-gramicidin] Hives and Rash 02/26/2018  . Penicillins Diarrhea, Nausea And Vomiting, and Rash 02/26/2018  . Plavix [clopidogrel] Nausea And Vomiting and Rash 02/26/2018  . Prochlorperazine edisylate Hives and Rash 02/26/2018  . Valtrex [valacyclovir hcl] Hives, Diarrhea, Nausea And Vomiting, and Rash 02/26/2018  . Vytorin [ezetimibe-simvastatin] Hives, Diarrhea, Nausea And Vomiting, and Rash 02/26/2018  . Zofran [ondansetron hcl] Hives and Rash 02/26/2018    Past Medical History:  Diagnosis Date  . CKD (chronic kidney disease)   . Colon polyps   . Diverticulosis   . TIA (transient ischemic attack)     Past Surgical History:  Procedure Laterality Date  . LEFT HEART CATH AND CORONARY ANGIOGRAPHY N/A 02/07/2018   Procedure: LEFT HEART CATH AND CORONARY ANGIOGRAPHY;  Surgeon: Dolores PattyBensimhon, Daniel R, MD;  Location: [facility];  Service: Cardiovascular;  Laterality: N/A;    Family History  Problem Relation Age of Onset  . Hypertension Father   . Tuberculosis Father   . Heart disease Mother   . Stroke Sister   . Stomach cancer Neg Hx   . Colon cancer Neg Hx     Social History   Socioeconomic History  . Marital status: Single    Spouse name: Not  on file  . Number of children: Not on file  . Years of education: Not on file  . Highest education level: Not on file  Occupational History  . Not on file  Social Needs  . Financial resource strain: Not on file  . Food insecurity:    Worry: Not on file    Inability: Not on file  . Transportation needs:    Medical: Not on file    Non-medical: Not on file  Tobacco Use  . Smoking status: Never Smoker  . Smokeless tobacco: Never Used  Substance and Sexual Activity  . Alcohol use: No    Frequency: Never  . Drug use: No  . Sexual activity: Never  Lifestyle  . Physical activity:    Days per week: Not on file    Minutes per session: Not on file  . Stress: Not on file  Relationships  . Social connections:    Talks on phone: Not on file    Gets together: Not on file    Attends religious service: Not on file    Active member of club or organization: Not on  file    Attends meetings of clubs or organizations: Not on file    Relationship status: Not on file  . Intimate partner violence:    Fear of current or ex partner: Not on file    Emotionally abused: Not on file    Physically abused: Not on file    Forced sexual activity: Not on file  Other Topics Concern  . Not on file  Social History Narrative  . Not on file      Review of systems: Review of Systems  Constitutional: Negative for fever and chills.  Positive for lack of energy, loss of appetite and weight loss HENT: Positive for difficulty hearing Eyes: Negative for blurred vision.  Respiratory: Negative for cough, shortness of breath and wheezing.   Cardiovascular: Negative for chest pain and palpitations.  Gastrointestinal: as per HPI Genitourinary: Negative for dysuria, urgency, and hematuria.  Positive for painful and frequent urination occasionally musculoskeletal: Positive for myalgias, back pain and joint pain.  Skin: Negative for itching and rash.  Neurological: Negative for dizziness, tremors, focal weakness,  seizures and loss of consciousness.  Endo/Heme/Allergies: Positive for seasonal allergies.  Psychiatric/Behavioral: Negative for depression, suicidal ideas and hallucinations.  Positive for anxiety All other systems reviewed and are negative.   Physical Exam: Vitals:   03/26/18 1420  BP: 130/80  Pulse: 76   Body mass index is 18.48 kg/m. Gen:      No acute distress, petite HEENT:  EOMI, sclera anicteric Neck:     No masses; no thyromegaly Lungs:    Clear to auscultation bilaterally; normal respiratory effort CV:         Regular rate and rhythm; no murmurs Abd:      + bowel sounds; soft, non-tender; no palpable masses, no distension Ext:    No edema; adequate peripheral perfusion Skin:      Warm and dry; no rash Neuro: alert and oriented x 3 Psych: normal mood and affect  Data Reviewed:  Reviewed labs, radiology imaging, old records and pertinent past GI work up   Assessment and Plan/Recommendations:  84 year old female with history of chronic irritable bowel syndrome, chronic GERD here with complaints of intermittent dysphagia and epigastric abdominal pain Schedule for upper GI series to evaluate, if has evidence of tight esophageal stricture , will plan for EGD with possible esophageal dilation Ranitidine at bedtime daily Continue Protonix 40 mg daily needed Explained to patient in detail that her potential benefit from surveillance colonoscopy is much lower than the risks do not recommend continuing colorectal cancer screening  25 minutes was spent face-to-face with the patient. Greater than 50% of the time used for counseling as well as treatment plan and follow-up. She had multiple questions which were answered to her satisfaction  K. Scherry Ran , MD [phone]    CC: Adrian Prince, MD

## 2018-03-27 ENCOUNTER — Encounter: Payer: Self-pay | Admitting: Gastroenterology

## 2018-03-28 ENCOUNTER — Ambulatory Visit: Payer: Medicare Other | Admitting: Gastroenterology

## 2018-03-28 ENCOUNTER — Encounter: Payer: Self-pay | Admitting: Internal Medicine

## 2018-03-28 ENCOUNTER — Ambulatory Visit: Payer: Self-pay | Admitting: Internal Medicine

## 2018-03-28 ENCOUNTER — Ambulatory Visit (INDEPENDENT_AMBULATORY_CARE_PROVIDER_SITE_OTHER): Payer: Medicare Other | Admitting: Internal Medicine

## 2018-03-28 ENCOUNTER — Telehealth: Payer: Self-pay | Admitting: Gastroenterology

## 2018-03-28 VITALS — BP 138/66 | HR 66 | Ht 61.0 in | Wt 96.0 lb

## 2018-03-28 DIAGNOSIS — E785 Hyperlipidemia, unspecified: Secondary | ICD-10-CM | POA: Diagnosis not present

## 2018-03-28 DIAGNOSIS — I2510 Atherosclerotic heart disease of native coronary artery without angina pectoris: Secondary | ICD-10-CM | POA: Diagnosis not present

## 2018-03-28 DIAGNOSIS — R0609 Other forms of dyspnea: Secondary | ICD-10-CM

## 2018-03-28 MED ORDER — EZETIMIBE 10 MG PO TABS: 10.0000 mg | ORAL_TABLET | Freq: Every day | ORAL | 3 refills | Status: DC

## 2018-03-28 NOTE — Patient Instructions (Signed)
Medication Instructions:   START zetia 10mg  once daily  Labwork:  FASTING blood work in 3 months to reassess cholesterol  Testing/Procedures:  NONE  Follow-Up:  THREE MONTHS with Dr. Rennis GoldenHilty  If you need a refill on your cardiac medications before your next appointment, please call your pharmacy.  Any Other Special Instructions Will Be Listed Below (If Applicable).

## 2018-03-28 NOTE — Telephone Encounter (Signed)
Returned call back to [facility] discontinued Omeprazole

## 2018-03-28 NOTE — Telephone Encounter (Signed)
Jill Mcdonald [facility] calling regarding prescription for Protonix 40mg . She stated that pt currently takes Omeprazole 40mg  so she needs to clarify if pt is going be on both meds or which one is the correct med.

## 2018-03-30 ENCOUNTER — Encounter: Payer: Self-pay | Admitting: Internal Medicine

## 2018-03-30 DIAGNOSIS — E785 Hyperlipidemia, unspecified: Secondary | ICD-10-CM | POA: Insufficient documentation

## 2018-03-30 DIAGNOSIS — R0609 Other forms of dyspnea: Secondary | ICD-10-CM

## 2018-03-30 DIAGNOSIS — I2510 Atherosclerotic heart disease of native coronary artery without angina pectoris: Secondary | ICD-10-CM | POA: Insufficient documentation

## 2018-03-30 NOTE — Progress Notes (Signed)
OFFICE CONSULT NOTE  Chief Complaint:  Chest pain, establish cardiologist  Primary Care Physician: Adrian Prince, MD  HPI:  Jill Mcdonald is a 84 y.o. female who is being seen today for the evaluation of pain at the request of Adrian Prince, MD.  This is a pleasant 84 year old female who was kindly referred by Dr. Evlyn Kanner for evaluation of chest pain.  History of TIA and hypertension and had worsening exertional chest pain in March and was admitted.  Bones were negative and she did have some T wave flattening.  Left heart catheterization by Dr. Gala Romney which demonstrated mild to moderate nonobstructive coronary disease, LVEF 60 to 65% with possible inferior hypokinesis.  It was ultimately felt that GERD was responsible for her symptoms.  In addition she had a complex compression fracture T7 that could be contributing to her chest pain and shortness of breath.  She returns today and still has complaints of some shortness of breath.  She does have marked kyphosis.  Is reasonable at 138/66.  Lab work showed total cholesterol 158, triglycerides 48, HDL 64 LDL 84.  LDL is less than 70.  PMHx:  Past Medical History:  Diagnosis Date  . CKD (chronic kidney disease)   . Colon polyps   . Diverticulosis   . TIA (transient ischemic attack)     Past Surgical History:  Procedure Laterality Date  . LEFT HEART CATH AND CORONARY ANGIOGRAPHY N/A 02/07/2018   Procedure: LEFT HEART CATH AND CORONARY ANGIOGRAPHY;  Surgeon: Dolores Patty, MD;  Location: [facility];  Service: Cardiovascular;  Laterality: N/A;    FAMHx:  Family History  Problem Relation Age of Onset  . Hypertension Father   . Tuberculosis Father   . Heart disease Mother   . Stroke Sister   . Stomach cancer Neg Hx   . Colon cancer Neg Hx     SOCHx:   reports that she has never smoked. She has never used smokeless tobacco. She reports that she does not drink alcohol or use drugs.  ALLERGIES:  Allergies  Allergen  Reactions  . Nitrofuran Derivatives Shortness Of Breath, Nausea And Vomiting and Palpitations    Sweating, coldness, weakness in legs, lightheadedness, no appetite  . Zyvox [Linezolid] Rash  . Amlodipine Besylate Hives and Nausea Only    Nightmares   . Amoxicillin Hives  . Buspirone     Dizziness   . Cefotetan Hives  . Cephalosporins Hives and Diarrhea  . Ciprofloxacin   . Cocamide Hives and Diarrhea  . Codeine Diarrhea and Nausea And Vomiting  . Cyclobenzaprine Diarrhea and Nausea And Vomiting  . Diclofenac     Dizziness, blurred vision and ringing in the ears.   . Latex Hives  . Macrobid [Nitrofurantoin Macrocrystal] Nausea And Vomiting  . Metronidazole   . Norvasc [Amlodipine] Hives, Diarrhea and Nausea And Vomiting  . Paroxetine   . Triple Antibiotic [Bacitracin-Neomycin-Polymyxin]   . Valdecoxib   . Zocor [Simvastatin]     Headache, migraine  . Adhesive [Tape] Rash  . Anesthetics, Amide Rash  . Bactrim [Sulfamethoxazole-Trimethoprim] Nausea Only and Rash  . Cefuroxime Axetil Hives, Nausea And Vomiting and Rash    Gi upset   . Celecoxib Hives and Rash  . Clarithromycin Nausea Only and Rash  . Coreg [Carvedilol] Hives and Rash  . Crestor [Rosuvastatin Calcium] Rash  . Doxycycline Diarrhea and Rash  . Lipitor [Atorvastatin Calcium] Hives and Rash    headache   . Neomycin-Bacitracin Zn-Polymyx Hives and  Rash  . Neosporin [Neomycin-Polymyxin-Gramicidin] Hives and Rash  . Penicillins Diarrhea, Nausea And Vomiting and Rash  . Plavix [Clopidogrel] Nausea And Vomiting and Rash  . Prochlorperazine Edisylate Hives and Rash  . Valtrex [Valacyclovir Hcl] Hives, Diarrhea, Nausea And Vomiting and Rash  . Vytorin [Ezetimibe-Simvastatin] Hives, Diarrhea, Nausea And Vomiting and Rash    Muscle pain and cramps  . Zofran [Ondansetron Hcl] Hives and Rash    ROS: Pertinent items noted in HPI and remainder of comprehensive ROS otherwise negative.  HOME MEDS: Current Outpatient  Medications on File Prior to Visit  Medication Sig Dispense Refill  . aspirin 325 MG tablet Take 325 mg by mouth daily.    . Biotin 1000 MCG tablet Take 1,000 mcg by mouth 3 (three) times daily.    . clobetasol ointment (TEMOVATE) 0.05 % Apply 1 application topically as needed.    . dicyclomine (BENTYL) 10 MG capsule Take 10 mg by mouth 2 (two) times daily as needed for spasms.    Marland Kitchen gabapentin (NEURONTIN) 300 MG capsule Take 600 mg by mouth 2 (two) times daily.    Marland Kitchen gemfibrozil (LOPID) 600 MG tablet Take 600 mg by mouth 2 (two) times daily before a meal.    . gentamicin ointment (GARAMYCIN) 0.1 % Apply 1 application topically 2 (two) times daily.    . ranitidine (ZANTAC) 150 MG tablet Take 150 mg by mouth daily as needed for heartburn.    . ranitidine (ZANTAC) 300 MG tablet Take 1 tablet (300 mg total) by mouth at bedtime. 90 tablet 3  . Red Yeast Rice 600 MG CAPS Take 600 mg by mouth daily.    Marland Kitchen tiZANidine (ZANAFLEX) 4 MG capsule Take 4 mg by mouth 3 (three) times daily as needed for muscle spasms.    . traMADol (ULTRAM) 50 MG tablet Take 50 mg by mouth every 6 (six) hours as needed for moderate pain.    . verapamil (CALAN) 120 MG tablet Take 120 mg by mouth at bedtime.    . verapamil (VERELAN PM) 240 MG 24 hr capsule Take 240 mg by mouth daily.    . Vitamin D, Ergocalciferol, (DRISDOL) 50000 units CAPS capsule Take 50,000 Units by mouth daily.     No current facility-administered medications on file prior to visit.     LABS/IMAGING: No results found for this or any previous visit (from the past 48 hour(s)). No results found.  LIPID PANEL: No results found for: CHOL, TRIG, HDL, CHOLHDL, VLDL, LDLCALC, LDLDIRECT  WEIGHTS: Wt Readings from Last 3 Encounters:  03/28/18 96 lb (43.5 kg)  03/26/18 97 lb 12.8 oz (44.4 kg)  02/26/18 95 lb 6.4 oz (43.3 kg)    VITALS: BP 138/66 (BP Location: Left Arm, Patient Position: Sitting, Cuff Size: Normal)   Pulse 66   Ht  (1.549 m)   Wt 96  lb (43.5 kg)   BMI 18.14 kg/m   EXAM: General appearance: alert and no distress Neck: no carotid bruit, no JVD, thyroid not enlarged, symmetric, no tenderness/mass/nodules and Kyphosis Lungs: diminished breath sounds bilaterally Heart: regular rate and rhythm Abdomen: soft, non-tender; bowel sounds normal; no masses,  no organomegaly Extremities: extremities normal, atraumatic, no cyanosis or edema Pulses: 2+ and symmetric Skin: Skin color, texture, turgor normal. No rashes or lesions Neurologic: Grossly normal Psych: Pleasant  EKG: Normal sinus rhythm at 66, low voltage QRS-personally reviewed- personally reviewed  ASSESSMENT: 1. Persistent chest pain-likely GERD/gastritis 2. Mild nonobstructive coronary disease cath (01/2018) -normal LV function 3.  Dyslipidemia-not at goal LDL less than 70  PLAN: 1.   Mrs. Mccaig has had persistent chest discomfort and some associated shortness of breath.  I suspect the shortness of breath may be more related to kyphosis.  She does have some recurrent upper mid epigastric and chest discomfort which is likely GERD/gastritis or could be an ulcer.  She is seen GI who recommended PPI therapy and there is consideration for upper endoscopy.  It sounds like she may need to proceed on this route.  She is not a target LDL I recommend starting ezetimibe 10 mg daily.  We will repeat a fasting lipid profile in 3 months and see her back at that time.  Thanks again for the kind referral.  Chrystie Nose, MD, [facility]  [facility]  Medical Director of the [facility]  [facility] Diplomate of the American Board of Clinical Lipidology Attending Cardiologist  Direct Dial: [phone]  Fax: [phone]  Website:  [email or web address] 03/30/2018, 9:17 PM

## 2018-04-11 ENCOUNTER — Telehealth: Payer: Self-pay | Admitting: Internal Medicine

## 2018-04-11 MED ORDER — EZETIMIBE 10 MG PO TABS: 10.0000 mg | ORAL_TABLET | Freq: Every day | ORAL | 3 refills | Status: DC

## 2018-04-11 NOTE — Telephone Encounter (Signed)
New Message   Pt c/o medication issue:  1. Name of Medication: Does not know  2. How are you currently taking this medication (dosage and times per day)? Not taking it yet  3. Are you having a reaction (difficulty breathing--STAT)? no  4. What is your medication issue? Pt states that Dr. Rennis Golden prescribed  her some medication but it was sent to the wrong pharmacy, it needs to go to the [facility]. Pt does not know the name of the medication. Please call

## 2018-04-11 NOTE — Telephone Encounter (Signed)
Spoke with pt, script for [facility]etia [facility].

## 2018-04-21 ENCOUNTER — Telehealth: Payer: Self-pay | Admitting: Internal Medicine

## 2018-04-21 ENCOUNTER — Ambulatory Visit (HOSPITAL_COMMUNITY)
Admission: RE | Admit: 2018-04-21 | Discharge: 2018-04-21 | Disposition: A | Discharge status: Home / Self Care | Payer: Medicare Other | Source: Ambulatory Visit | Attending: Gastroenterology | Admitting: Gastroenterology

## 2018-04-21 DIAGNOSIS — K219 Gastro-esophageal reflux disease without esophagitis: Secondary | ICD-10-CM | POA: Diagnosis not present

## 2018-04-21 DIAGNOSIS — K588 Other irritable bowel syndrome: Secondary | ICD-10-CM | POA: Diagnosis not present

## 2018-04-21 DIAGNOSIS — R1013 Epigastric pain: Secondary | ICD-10-CM | POA: Insufficient documentation

## 2018-04-21 DIAGNOSIS — R1310 Dysphagia, unspecified: Secondary | ICD-10-CM | POA: Diagnosis present

## 2018-04-21 NOTE — Telephone Encounter (Signed)
Received records from [facility] on 04/21/18, Appt 07/09/18 @ 3:00PM. NV

## 2018-05-05 ENCOUNTER — Ambulatory Visit (INDEPENDENT_AMBULATORY_CARE_PROVIDER_SITE_OTHER): Payer: Medicare Other | Admitting: Sports Medicine

## 2018-05-05 VITALS — BP 130/78 | Ht 61.0 in | Wt 95.0 lb

## 2018-05-05 DIAGNOSIS — I2510 Atherosclerotic heart disease of native coronary artery without angina pectoris: Secondary | ICD-10-CM | POA: Diagnosis not present

## 2018-05-05 DIAGNOSIS — L84 Corns and callosities: Secondary | ICD-10-CM | POA: Diagnosis present

## 2018-05-05 DIAGNOSIS — M2142 Flat foot [pes planus] (acquired), left foot: Secondary | ICD-10-CM | POA: Diagnosis not present

## 2018-05-05 NOTE — Patient Instructions (Signed)
It was great seeing you today! We think you have a corn.  We sent a referral to podiatry.   Corns and Calluses Corns are small areas of thickened skin that occur on the top, sides, or tip of a toe. They contain a cone-shaped core with a point that can press on a nerve below. This causes pain. Calluses are areas of thickened skin that can occur anywhere on the body including hands, fingers, palms, soles of the feet, and heels.Calluses are usually larger than corns. What are the causes? Corns and calluses are caused by rubbing (friction) or pressure, such as from shoes that are too tight or do not fit properly. What increases the risk? Corns are more likely to develop in people who have toe deformities, such as hammer toes. Since calluses can occur with friction to any area of the skin, calluses are more likely to develop in people who:  Work with their hands.  Wear shoes that fit poorly, shoes that are too tight, or shoes that are high-heeled.  Have toes deformities.  What are the signs or symptoms? Symptoms of a corn or callus include:  A hard growth on the skin.  Pain or tenderness under the skin.  Redness and swelling.  Increased discomfort while wearing tight-fitting shoes.  How is this diagnosed? Corns and calluses may be diagnosed with a medical history and physical exam. How is this treated? Corns and calluses may be treated with:  Removing the cause of the friction or pressure. This may include: ? Changing your shoes. ? Wearing shoe inserts (orthotics) or other protective layers in your shoes, such as a corn pad. ? Wearing gloves.  Medicines to help soften skin in the hardened, thickened areas.  Reducing the size of the corn or callus by removing the dead layers of skin.  Antibiotic medicines to treat infection.  Surgery, if a toe deformity is the cause.  Follow these instructions at home:  Take medicines only as directed by your health care provider.  If you  were prescribed an antibiotic, finish all of it even if you start to feel better.  Wear shoes that fit well. Avoid wearing high-heeled shoes and shoes that are too tight or too loose.  Wear any padding, protective layers, gloves, or orthotics as directed by your health care provider.  Soak your hands or feet and then use a file or pumice stone to soften your corn or callus. Do this as directed by your health care provider.  Check your corn or callus every day for signs of infection. Watch for: ? Redness, swelling, or pain. ? Fluid, blood, or pus. Contact a health care provider if:  Your symptoms do not improve with treatment.  You have increased redness, swelling, or pain at the site of your corn or callus.  You have fluid, blood, or pus coming from your corn or callus.  You have new symptoms. This information is not intended to replace advice given to you by your health care provider. Make sure you discuss any questions you have with your health care provider. Document Released: 08/25/2004 Document Revised: 06/08/2016 Document Reviewed: 11/15/2014 Elsevier Interactive Patient Education  [employer]2018 Elsevier Inc.

## 2018-05-05 NOTE — Progress Notes (Addendum)
  Subjective:    Jill Mcdonald is a 84 y.o. old female here with left first and second toe pain  HPI Patient presents with left second toe pain for over 3 years. She has hyperkeratinized skin over medial aspect of the PIP joint.  She says she has been shaving the hyper keratinized skin to help with the pain in the past.  She says she is afraid of shaving anymore. She has history of multiple bilateral feet surgeries for bunions in the past.  She also have history of pes planus and callus in both feet.  She denies recent change to her pain.  She says it is difficult to walk or wear shoes.  She says it is sensitive to touch.  PMH/Problem List: has Chest pain, rule out acute myocardial infarction; HTN (hypertension); GERD (gastroesophageal reflux disease); Abdominal pain, epigastric; CAD in native artery; Dyslipidemia, goal LDL below 70; and DOE (dyspnea on exertion) on their problem list.   has a past medical history of CKD (chronic kidney disease), Colon polyps, Diverticulosis, and TIA (transient ischemic attack).  FH:  Family History  Problem Relation Age of Onset  . Hypertension Father   . Tuberculosis Father   . Heart disease Mother   . Stroke Sister   . Stomach cancer Neg Hx   . Colon cancer Neg Hx     SH Social History   Tobacco Use  . Smoking status: Never Smoker  . Smokeless tobacco: Never Used  Substance Use Topics  . Alcohol use: No    Frequency: Never  . Drug use: No    Review of Systems Per HPI    Objective:     Vitals:   05/05/18 1403  BP: 130/78  Weight: 95 lb (43.1 kg)  Height: 5\' 1"  (1.549 m)   Body mass index is 17.95 kg/m.  Physical Exam  Foot exam:  She has hyperkeratinized skin over the medial aspect of PIP joint in her second left toe which is tender to palpation.  Callus over palmar aspect of both feet medially and laterally.  Multiple scars from prior surgery over the dorsal aspect of both feet.  Pes planus bilaterally.  Significant bunion in her  right foot.  Neurovascularly intact  Assessment and Plan:  1. Corn of toe - Ambulatory referral to Podiatry  2. Callus of foot -Ambulatory referral to podiatry  3. Pes planus of left foot -Gave her insole  [name], MD 05/05/18 Pager: [phone]   Patient seen and evaluated with the resident. I agree with the above plan of care. We will refer her to podiatry and will give her additional metatarsal pads for her shoes. Follow-up as needed.

## 2018-05-12 ENCOUNTER — Encounter: Payer: Self-pay | Admitting: Podiatry

## 2018-05-12 ENCOUNTER — Ambulatory Visit (INDEPENDENT_AMBULATORY_CARE_PROVIDER_SITE_OTHER): Payer: Medicare Other

## 2018-05-12 ENCOUNTER — Ambulatory Visit (INDEPENDENT_AMBULATORY_CARE_PROVIDER_SITE_OTHER): Payer: Medicare Other | Admitting: Podiatry

## 2018-05-12 DIAGNOSIS — I2510 Atherosclerotic heart disease of native coronary artery without angina pectoris: Secondary | ICD-10-CM

## 2018-05-12 DIAGNOSIS — Q828 Other specified congenital malformations of skin: Secondary | ICD-10-CM

## 2018-05-12 DIAGNOSIS — M2042 Other hammer toe(s) (acquired), left foot: Secondary | ICD-10-CM | POA: Diagnosis not present

## 2018-05-12 DIAGNOSIS — M2041 Other hammer toe(s) (acquired), right foot: Secondary | ICD-10-CM

## 2018-05-12 DIAGNOSIS — M216X9 Other acquired deformities of unspecified foot: Secondary | ICD-10-CM

## 2018-05-12 NOTE — Progress Notes (Signed)
Subjective:    Patient ID: Jill Mcdonald, female    DOB: 07/19/1930, 84 y.o.   MRN: [id number]  HPI 84 year old female presents the office today for concerns of a painful corn of the left second toe, pointing the medial aspect.  She states she has had numerous surgeries to both of her feet and overall she gets pain to her left foot.  She also has a painful callus submetatarsal 5 that she points to.  She recently just moved here from [place] state and she was seen in the area she is given the callus trimmed but it became symptomatic.  She denies any swelling or redness or drainage or pus.  She tries to wear separator between her toes to help with the pain.  She has no other concerns.   Review of Systems  All other systems reviewed and are negative.  Past Medical History:  Diagnosis Date  . CKD (chronic kidney disease)   . Colon polyps   . Diverticulosis   . TIA (transient ischemic attack)     Past Surgical History:  Procedure Laterality Date  . LEFT HEART CATH AND CORONARY ANGIOGRAPHY N/A 02/07/2018   Procedure: LEFT HEART CATH AND CORONARY ANGIOGRAPHY;  Surgeon: Dolores Patty, MD;  Location: [facility];  Service: Cardiovascular;  Laterality: N/A;     Current Outpatient Medications:  .  aspirin 325 MG tablet, Take 325 mg by mouth daily., Disp: , Rfl:  .  Biotin 1000 MCG tablet, Take 1,000 mcg by mouth 3 (three) times daily., Disp: , Rfl:  .  clobetasol ointment (TEMOVATE) 0.05 %, Apply 1 application topically as needed., Disp: , Rfl:  .  dicyclomine (BENTYL) 10 MG capsule, Take 10 mg by mouth 2 (two) times daily as needed for spasms., Disp: , Rfl:  .  ezetimibe (ZETIA) 10 MG tablet, Take 1 tablet (10 mg total) by mouth daily., Disp: 90 tablet, Rfl: 3 .  gabapentin (NEURONTIN) 300 MG capsule, Take 600 mg by mouth 2 (two) times daily., Disp: , Rfl:  .  gemfibrozil (LOPID) 600 MG tablet, Take 600 mg by mouth 2 (two) times daily before a meal., Disp: , Rfl:  .   gentamicin ointment (GARAMYCIN) 0.1 %, Apply 1 application topically 2 (two) times daily., Disp: , Rfl:  .  ranitidine (ZANTAC) 150 MG tablet, Take 150 mg by mouth daily as needed for heartburn., Disp: , Rfl:  .  ranitidine (ZANTAC) 300 MG tablet, Take 1 tablet (300 mg total) by mouth at bedtime., Disp: 90 tablet, Rfl: 3 .  Red Yeast Rice 600 MG CAPS, Take 600 mg by mouth daily., Disp: , Rfl:  .  tiZANidine (ZANAFLEX) 4 MG capsule, Take 4 mg by mouth 3 (three) times daily as needed for muscle spasms., Disp: , Rfl:  .  traMADol (ULTRAM) 50 MG tablet, Take 50 mg by mouth every 6 (six) hours as needed for moderate pain., Disp: , Rfl:  .  verapamil (CALAN) 120 MG tablet, Take 120 mg by mouth at bedtime., Disp: , Rfl:  .  verapamil (VERELAN PM) 240 MG 24 hr capsule, Take 240 mg by mouth daily., Disp: , Rfl:  .  Vitamin D, Ergocalciferol, (DRISDOL) 50000 units CAPS capsule, Take 50,000 Units by mouth daily., Disp: , Rfl:   Allergies  Allergen Reactions  . Nitrofuran Derivatives Shortness Of Breath, Nausea And Vomiting and Palpitations    Sweating, coldness, weakness in legs, lightheadedness, no appetite  . Zyvox [Linezolid] Rash  . Amlodipine  Besylate Hives and Nausea Only    Nightmares   . Amoxicillin Hives  . Buspirone     Dizziness   . Cefotetan Hives  . Cephalosporins Hives and Diarrhea  . Ciprofloxacin   . Cocamide Hives and Diarrhea  . Codeine Diarrhea and Nausea And Vomiting  . Cyclobenzaprine Diarrhea and Nausea And Vomiting  . Diclofenac     Dizziness, blurred vision and ringing in the ears.   . Latex Hives  . Macrobid [Nitrofurantoin Macrocrystal] Nausea And Vomiting  . Metronidazole   . Norvasc [Amlodipine] Hives, Diarrhea and Nausea And Vomiting  . Paroxetine   . Triple Antibiotic [Bacitracin-Neomycin-Polymyxin]   . Valdecoxib   . Zocor [Simvastatin]     Headache, migraine  . Adhesive [Tape] Rash  . Anesthetics, Amide Rash  . Bactrim [Sulfamethoxazole-Trimethoprim]  Nausea Only and Rash  . Cefuroxime Axetil Hives, Nausea And Vomiting and Rash    Gi upset   . Celecoxib Hives and Rash  . Clarithromycin Nausea Only and Rash  . Coreg [Carvedilol] Hives and Rash  . Crestor [Rosuvastatin Calcium] Rash  . Doxycycline Diarrhea and Rash  . Lipitor [Atorvastatin Calcium] Hives and Rash    headache   . Neomycin-Bacitracin Zn-Polymyx Hives and Rash  . Neosporin [Neomycin-Polymyxin-Gramicidin] Hives and Rash  . Penicillins Diarrhea, Nausea And Vomiting and Rash  . Plavix [Clopidogrel] Nausea And Vomiting and Rash  . Prochlorperazine Edisylate Hives and Rash  . Valtrex [Valacyclovir Hcl] Hives, Diarrhea, Nausea And Vomiting and Rash  . Vytorin [Ezetimibe-Simvastatin] Hives, Diarrhea, Nausea And Vomiting and Rash    Muscle pain and cramps  . Zofran [Ondansetron Hcl] Hives and Rash         Objective:   Physical Exam  General: AAO x3, NAD, presents today with her daughter  Dermatological: Hyperkeratotic lesion present medial left second PIPJ as well as left submetatarsal 5.  Upon debridement there is no underlying ulceration, drainage or any clinical signs of infection present.  There is no other open lesions or pre-ulcerative lesion identified today.  Scars from prior surgery are all well-healed.  Vascular: Dorsalis Pedis artery and Posterior Tibial artery pedal pulses are 2/4 bilateral with immedate capillary fill time.There is no pain with calf compression, swelling, warmth, erythema.   Neruologic: Grossly intact via light touch bilateral. Protective threshold with Semmes Wienstein monofilament intact to all pedal sites bilateral.   Musculoskeletal: Rigid hammertoe contracture present left second toe.  There is prominence of metatarsal heads plantarly with atrophy of the fat pad.  Decreased range of motion first MPJ there is pain with MPJ range of motion of the left side.  HAV present on the right foot. muscular strength 5/5 in all groups tested  bilateral.  Gait: Unassisted, Nonantalgic.     Assessment & Plan:  84 year old female with symptomatic hyperkeratotic lesions left foot x2  -Treatment options discussed including all alternatives, risks, and complications -Etiology of symptoms were discussed -X-rays were obtained and reviewed with the patient.  Adaptive changes present to left first MPJ.  Hammertoe contractures are present.  No evidence of acute fracture. -Sharply debrided hyperkeratotic lesions left foot x2 without any complications or bleeding.  Dispensed various offloading pads.  Discussed the change in shoes in order to help take pressure off her foot. -Follow-up as needed.  Call me for concerns.  There is no further questions today.  Vivi BarrackMatthew R Damante Spragg DPM

## 2018-05-14 ENCOUNTER — Ambulatory Visit (AMBULATORY_SURGERY_CENTER): Payer: Self-pay

## 2018-05-14 VITALS — Ht 61.0 in | Wt 93.8 lb

## 2018-05-14 DIAGNOSIS — R1310 Dysphagia, unspecified: Secondary | ICD-10-CM

## 2018-05-14 NOTE — Progress Notes (Signed)
Denies allergies to eggs or soy products. Denies complication of anesthesia or sedation. Denies use of weight loss medication. Denies use of O2.   Emmi instructions declined.  

## 2018-05-19 ENCOUNTER — Other Ambulatory Visit: Payer: Self-pay

## 2018-05-19 ENCOUNTER — Ambulatory Visit (AMBULATORY_SURGERY_CENTER): Payer: Medicare Other | Admitting: Gastroenterology

## 2018-05-19 ENCOUNTER — Encounter: Payer: Self-pay | Admitting: Gastroenterology

## 2018-05-19 VITALS — BP 133/81 | HR 71 | Temp 98.9°F | Resp 12 | Ht 61.0 in | Wt 93.0 lb

## 2018-05-19 DIAGNOSIS — K259 Gastric ulcer, unspecified as acute or chronic, without hemorrhage or perforation: Secondary | ICD-10-CM | POA: Diagnosis not present

## 2018-05-19 DIAGNOSIS — R1310 Dysphagia, unspecified: Secondary | ICD-10-CM

## 2018-05-19 MED ORDER — SODIUM CHLORIDE 0.9 % IV SOLN: 500.0000 mL | Freq: Once | INTRAVENOUS | Status: AC

## 2018-05-19 MED ORDER — SUCRALFATE 1 GM/10ML PO SUSP: 1.0000 g | Freq: Two times a day (BID) | ORAL | 1 refills | Status: DC

## 2018-05-19 NOTE — Patient Instructions (Signed)
Handouts given:  Nsaids and peptic ulcers, Dilation diet, GERD, and H. Pylori  YOU HAD AN ENDOSCOPIC PROCEDURE TODAY AT THE  ENDOSCOPY CENTER:   Refer to the procedure report that was given to you for any specific questions about what was found during the examination.  If the procedure report does not answer your questions, please call your gastroenterologist to clarify.  If you requested that your care partner not be given the details of your procedure findings, then the procedure report has been included in a sealed envelope for you to review at your convenience later.  YOU SHOULD EXPECT: Some feelings of bloating in the abdomen. Passage of more gas than usual.  Walking can help get rid of the air that was put into your GI tract during the procedure and reduce the bloating. If you had a lower endoscopy (such as a colonoscopy or flexible sigmoidoscopy) you may notice spotting of blood in your stool or on the toilet paper. If you underwent a bowel prep for your procedure, you may not have a normal bowel movement for a few days.  Please Note:  You might notice some irritation and congestion in your nose or some drainage.  This is from the oxygen used during your procedure.  There is no need for concern and it should clear up in a day or so.  SYMPTOMS TO REPORT IMMEDIATELY:   Following upper endoscopy (EGD)  Vomiting of blood or coffee ground material  New chest pain or pain under the shoulder blades  Painful or persistently difficult swallowing  New shortness of breath  Fever of 100F or higher  Black, tarry-looking stools  For urgent or emergent issues, a gastroenterologist can be reached at any hour by calling [phone].   DIET:  We do recommend a small meal at first, but then you may proceed to your regular diet.  Drink plenty of fluids but you should avoid alcoholic beverages for 24 hours.  ACTIVITY:  You should plan to take it easy for the rest of today and you should NOT  DRIVE or use heavy machinery until tomorrow (because of the sedation medicines used during the test).    FOLLOW UP: Our staff will call the number listed on your records the next business day following your procedure to check on you and address any questions or concerns that you may have regarding the information given to you following your procedure. If we do not reach you, we will leave a message.  However, if you are feeling well and you are not experiencing any problems, there is no need to return our call.  We will assume that you have returned to your regular daily activities without incident.  If any biopsies were taken you will be contacted by phone or by letter within the next 1-3 weeks.  Please call us at [phone] if you have not heard about the biopsies in 3 weeks.    SIGNATURES/CONFIDENTIALITY: You and/or your care partner have signed paperwork which will be entered into your electronic medical record.  These signatures attest to the fact that that the information above on your After Visit Summary has been reviewed and is understood.  Full responsibility of the confidentiality of this discharge information lies with you and/or your care-partner.

## 2018-05-19 NOTE — Progress Notes (Signed)
Called to room to assist during endoscopic procedure.  Patient ID and intended procedure confirmed with present staff. Received instructions for my participation in the procedure from the performing physician.  

## 2018-05-19 NOTE — Progress Notes (Signed)
Report to PACU, RN, vss, BBS= Clear.  

## 2018-05-19 NOTE — Progress Notes (Signed)
Pt's states no medical or surgical changes since previsit or office visit. 

## 2018-05-19 NOTE — Op Note (Signed)
[facility] Patient Name: Jill Mcdonald Procedure Date: 05/19/2018 3:20 PM MRN: [id number] Endoscopist: Napoleon FormKavitha V. Yonis Carreon , MD Age: 84 Referring MD:  Date of Birth: [birth year] Gender: Female Account #: [id number] Procedure:                Upper GI endoscopy Indications:              Dysphagia Medicines:                Monitored Anesthesia Care Procedure:                Pre-Anesthesia Assessment:                           - Prior to the procedure, a History and Physical                            was performed, and patient medications and                            allergies were reviewed. The patient's tolerance of                            previous anesthesia was also reviewed. The risks                            and benefits of the procedure and the sedation                            options and risks were discussed with the patient.                            All questions were answered, and informed consent                            was obtained. Prior Anticoagulants: The patient has                            taken no previous anticoagulant or antiplatelet                            agents. ASA Grade Assessment: III - A patient with                            severe systemic disease. After reviewing the risks                            and benefits, the patient was deemed in                            satisfactory condition to undergo the procedure.                           After obtaining informed consent, the endoscope was  passed under direct vision. Throughout the                            procedure, the patient's blood pressure, pulse, and                            oxygen saturations were monitored continuously. The                            Endoscope was introduced through the mouth, and                            advanced to the second part of duodenum. The upper                            GI endoscopy was accomplished  without difficulty.                            The patient tolerated the procedure well. Scope In: Scope Out: Findings:                 Esophagogastric landmarks were identified: the                            Z-line was found at 30 cm and the site of hiatal                            narrowing was found at 35 cm from the incisors.                           No endoscopic abnormality was evident in the                            esophagus to explain the patient's complaint of                            dysphagia. It was decided, however, to proceed with                            dilation of the lower third of the esophagus and at                            the gastroesophageal junction. A TTS dilator was                            passed through the scope. Dilation with a                            13.5-14.5-15.5 mm balloon dilator was performed to                            15.5 mm. The dilation site was examined following  endoscope reinsertion and showed no change.                           A 5 cm hiatal hernia was present.                           Few non-bleeding cratered gastric ulcers with no                            stigmata of bleeding were found in the gastric                            antrum and in the prepyloric region of the stomach.                            The largest lesion was 4 mm in largest dimension.                            Biopsies were taken with a cold forceps for                            Helicobacter pylori testing.                           The examined duodenum was normal. Complications:            No immediate complications. Estimated Blood Loss:     Estimated blood loss was minimal. Impression:               - Esophagogastric landmarks identified.                           - No endoscopic esophageal abnormality to explain                            patient's dysphagia. Esophagus dilated. Dilated.                           -  Non-bleeding gastric ulcers with no stigmata of                            bleeding. Biopsied.                           - Normal examined duodenum. Recommendation:           - Patient has a contact number available for                            emergencies. The signs and symptoms of potential                            delayed complications were discussed with the                            patient. Return to normal activities tomorrow.  Written discharge instructions were provided to the                            patient.                           - Resume previous diet.                           - Continue present medications.                           - No aspirin, ibuprofen, naproxen, or other                            non-steroidal anti-inflammatory drugs.                           - Use sucralfate tablets 1 gram PO BID for 1 month. Napoleon Form, MD 05/19/2018 3:40:04 PM This report has been signed electronically.

## 2018-05-20 ENCOUNTER — Telehealth: Payer: Self-pay

## 2018-05-20 NOTE — Telephone Encounter (Signed)
Left message, will call back later today, B.Lilly Gasser RN 

## 2018-05-20 NOTE — Telephone Encounter (Signed)
  Follow up Call-  Call back number 05/19/2018  Post procedure Call Back phone  # [phone]  Permission to leave phone message Yes     Patient questions:  Do you have a fever, pain , or abdominal swelling? No. Pain Score  0 *  Have you tolerated food without any problems? Yes.    Have you been able to return to your normal activities? Yes.    Do you have any questions about your discharge instructions: Diet   No. Medications  No. Follow up visit  No.  Do you have questions or concerns about your Care? No.  Actions: * If pain score is 4 or above: No action needed, pain <4.

## 2018-05-21 ENCOUNTER — Other Ambulatory Visit: Payer: Self-pay

## 2018-05-21 ENCOUNTER — Telehealth: Payer: Self-pay | Admitting: Gastroenterology

## 2018-05-21 MED ORDER — SUCRALFATE 1 G PO TABS: 1.0000 g | ORAL_TABLET | Freq: Two times a day (BID) | ORAL | 3 refills | Status: AC

## 2018-05-21 NOTE — Telephone Encounter (Signed)
Notified that the pharmacist confirmed the Rx co-pay will be $5

## 2018-05-21 NOTE — Telephone Encounter (Signed)
[facility]

## 2018-05-24 ENCOUNTER — Encounter: Payer: Self-pay | Admitting: Gastroenterology

## 2018-07-09 ENCOUNTER — Encounter: Payer: Self-pay | Admitting: Internal Medicine

## 2018-07-09 ENCOUNTER — Ambulatory Visit (INDEPENDENT_AMBULATORY_CARE_PROVIDER_SITE_OTHER): Payer: Medicare Other | Admitting: Internal Medicine

## 2018-07-09 VITALS — BP 152/86 | HR 69 | Ht 61.0 in | Wt 94.0 lb

## 2018-07-09 DIAGNOSIS — E785 Hyperlipidemia, unspecified: Secondary | ICD-10-CM | POA: Diagnosis not present

## 2018-07-09 DIAGNOSIS — R0609 Other forms of dyspnea: Secondary | ICD-10-CM | POA: Diagnosis not present

## 2018-07-09 DIAGNOSIS — I2510 Atherosclerotic heart disease of native coronary artery without angina pectoris: Secondary | ICD-10-CM | POA: Diagnosis not present

## 2018-07-09 NOTE — Progress Notes (Signed)
OFFICE CONSULT NOTE  Chief Complaint:  Chest pain, establish cardiologist  Primary Care Physician: Adrian Prince, MD  HPI:  Jill Mcdonald is a 84 y.o. female who is being seen today for the evaluation of pain at the request of Adrian Prince, MD.  This is a pleasant 84 year old female who was kindly referred by Dr. Evlyn Kanner for evaluation of chest pain.  History of TIA and hypertension and had worsening exertional chest pain in March and was admitted.  Bones were negative and she did have some T wave flattening.  Left heart catheterization by Dr. Gala Romney which demonstrated mild to moderate nonobstructive coronary disease, LVEF 60 to 65% with possible inferior hypokinesis.  It was ultimately felt that GERD was responsible for her symptoms.  In addition she had a complex compression fracture T7 that could be contributing to her chest pain and shortness of breath.  She returns today and still has complaints of some shortness of breath.  She does have marked kyphosis.  Is reasonable at 138/66.  Lab work showed total cholesterol 158, triglycerides 48, HDL 64 LDL 84.  LDL is less than 70.  07/09/2018  Mrs. Eckstrom was seen today in follow-up.  Overall she seems to be doing well.  Her symptoms did seem to be more consistent with GERD/gastritis.  She was seen by [facility] and underwent upper endoscopy which indicated multiple ulcers.  She was started on acid suppressive therapy and her symptoms have improved.  She is not yet managed to gain a lot of weight back but does have more appetite.  Her chest pain has resolved.  She was started on ezetimibe with goal LDL less than 70.,  Given angiographic coronary disease on her heart cath.  She has not had a repeat lipid profile.  PMHx:  Past Medical History:  Diagnosis Date  . Anemia   . Arthritis   . Blood transfusion without reported diagnosis   . Cataract   . CKD (chronic kidney disease)   . Clotting disorder (HCC)   . Colon polyps   .  Diverticulosis   . GERD (gastroesophageal reflux disease)   . Heart murmur   . Hyperlipidemia   . Hypertension   . Osteoporosis   . Stroke (HCC)   . TIA (transient ischemic attack)     Past Surgical History:  Procedure Laterality Date  . BACK SURGERY    . CHOLECYSTECTOMY    . COLON SURGERY    . FOOT SURGERY Bilateral   . HERNIA REPAIR    . INCONTINENCE SURGERY    . LEFT HEART CATH AND CORONARY ANGIOGRAPHY N/A 02/07/2018   Procedure: LEFT HEART CATH AND CORONARY ANGIOGRAPHY;  Surgeon: Dolores Patty, MD;  Location: [facility]VASIVE [facility];  Service: Cardiovascular;  Laterality: N/A;    FAMHx:  Family History  Problem Relation Age of Onset  . Hypertension Father   . Tuberculosis Father   . Heart disease Mother   . Stroke Sister   . Stomach cancer Neg Hx   . Colon cancer Neg Hx   . Esophageal cancer Neg Hx   . Liver cancer Neg Hx   . Pancreatic cancer Neg Hx   . Rectal cancer Neg Hx     SOCHx:   reports that she has quit smoking. She has never used smokeless tobacco. She reports that she does not drink alcohol or use drugs.  ALLERGIES:  Allergies  Allergen Reactions  . Nitrofuran Derivatives Shortness Of Breath, Nausea And Vomiting and  Palpitations    Sweating, coldness, weakness in legs, lightheadedness, no appetite  . Zyvox [Linezolid] Rash  . Amlodipine Besylate Hives and Nausea Only    Nightmares   . Amoxicillin Hives  . Buspirone     Dizziness   . Cefotetan Hives  . Cephalosporins Hives and Diarrhea  . Ciprofloxacin   . Cocamide Hives and Diarrhea  . Codeine Diarrhea and Nausea And Vomiting  . Cyclobenzaprine Diarrhea and Nausea And Vomiting  . Diclofenac     Dizziness, blurred vision and ringing in the ears.   . Latex Hives  . Macrobid [Nitrofurantoin Macrocrystal] Nausea And Vomiting  . Metronidazole   . Norvasc [Amlodipine] Hives, Diarrhea and Nausea And Vomiting  . Paroxetine   . Triple Antibiotic [Bacitracin-Neomycin-Polymyxin]   . Valdecoxib     . Zocor [Simvastatin]     Headache, migraine  . Adhesive [Tape] Rash  . Anesthetics, Amide Rash  . Bactrim [Sulfamethoxazole-Trimethoprim] Nausea Only and Rash  . Cefuroxime Axetil Hives, Nausea And Vomiting and Rash    Gi upset   . Celecoxib Hives and Rash  . Clarithromycin Nausea Only and Rash  . Coreg [Carvedilol] Hives and Rash  . Crestor [Rosuvastatin Calcium] Rash  . Doxycycline Diarrhea and Rash  . Lipitor [Atorvastatin Calcium] Hives and Rash    headache   . Neomycin-Bacitracin Zn-Polymyx Hives and Rash  . Neosporin [Neomycin-Polymyxin-Gramicidin] Hives and Rash  . Penicillins Diarrhea, Nausea And Vomiting and Rash  . Plavix [Clopidogrel] Nausea And Vomiting and Rash  . Prochlorperazine Edisylate Hives and Rash  . Valtrex [Valacyclovir Hcl] Hives, Diarrhea, Nausea And Vomiting and Rash  . Vytorin [Ezetimibe-Simvastatin] Hives, Diarrhea, Nausea And Vomiting and Rash    Muscle pain and cramps  . Zofran [Ondansetron Hcl] Hives and Rash    ROS: Pertinent items noted in HPI and remainder of comprehensive ROS otherwise negative.  HOME MEDS: Current Outpatient Medications on File Prior to Visit  Medication Sig Dispense Refill  . aspirin 325 MG tablet Take 325 mg by mouth daily.    . Biotin 1000 MCG tablet Take 1,000 mcg by mouth 3 (three) times daily.    . clobetasol ointment (TEMOVATE) 0.05 % Apply 1 application topically as needed.    . dicyclomine (BENTYL) 10 MG capsule Take 10 mg by mouth 2 (two) times daily as needed for spasms.    Marland Kitchen ezetimibe (ZETIA) 10 MG tablet Take 1 tablet (10 mg total) by mouth daily. 90 tablet 3  . gabapentin (NEURONTIN) 300 MG capsule Take 600 mg by mouth 2 (two) times daily.    Marland Kitchen gemfibrozil (LOPID) 600 MG tablet Take 600 mg by mouth 2 (two) times daily before a meal.    . gentamicin ointment (GARAMYCIN) 0.1 % Apply 1 application topically 2 (two) times daily.    . ranitidine (ZANTAC) 150 MG tablet Take 150 mg by mouth daily as needed for  heartburn.    . ranitidine (ZANTAC) 300 MG tablet Take 1 tablet (300 mg total) by mouth at bedtime. 90 tablet 3  . sucralfate (CARAFATE) 1 g tablet Take 1 tablet (1 g total) by mouth 2 (two) times daily. Melt into 10 ml of water and take by mouth when dissolved 60 tablet 3  . tiZANidine (ZANAFLEX) 4 MG capsule Take 4 mg by mouth 3 (three) times daily as needed for muscle spasms.    . traMADol (ULTRAM) 50 MG tablet Take 50 mg by mouth every 6 (six) hours as needed for moderate pain.    Marland Kitchen  verapamil (CALAN) 120 MG tablet Take 120 mg by mouth at bedtime.    . verapamil (VERELAN PM) 240 MG 24 hr capsule Take 240 mg by mouth daily.    . Vitamin D, Ergocalciferol, (DRISDOL) 50000 units CAPS capsule Take 50,000 Units by mouth daily.    Marland Kitchen. zolpidem (AMBIEN) 5 MG tablet Take 5 mg by mouth at bedtime as needed for sleep.     Current Facility-Administered Medications on File Prior to Visit  Medication Dose Route Frequency Provider Last Rate Last Dose  . 0.9 %  sodium chloride infusion  500 mL Intravenous Once Nandigam, Kavitha V, MD        LABS/IMAGING: No results found for this or any previous visit (from the past 48 hour(s)). No results found.  LIPID PANEL: No results found for: CHOL, TRIG, HDL, CHOLHDL, VLDL, LDLCALC, LDLDIRECT  WEIGHTS: Wt Readings from Last 3 Encounters:  07/09/18 94 lb (42.6 kg)  05/19/18 93 lb (42.2 kg)  05/14/18 93 lb 12.8 oz (42.5 kg)    VITALS: BP (!) 152/86   Pulse 69   Ht 5\' 1"  (1.549 m)   Wt 94 lb (42.6 kg)   BMI 17.76 kg/m   EXAM: Deferred  EKG: Normal sinus rhythm at 69, possible left atrial enlargement-personally reviewed  ASSESSMENT: 1. Persistent chest pain-likely GERD/gastritis 2. Mild nonobstructive coronary disease cath (01/2018) -normal LV function 3. Dyslipidemia-not at goal LDL less than 70 4. History of stroke x2 in 2009 and 2015 5. History of PFO-not closed  PLAN: 1.   Mrs. Orvan FalconerCampbell had nonobstructive coronary disease by cath but has a  dyslipidemia not at target LDL less than 70.  She is now on ezetimibe and will plan a repeat lipid profile today.  Fortunately her chest discomfort has resolved and was found to have multiple gastric ulcers and gastritis.  She is now on acid suppressive therapy and her symptoms have improved.  Hopefully her appetite will improve and she will gain up some weight as she is still underweight.  I have reviewed records from [place] in [place].  This demonstrated history of stroke in the past both in 2009 and 2015.  She was found to have PFO however this is not been closed based on a negative data at the time.  We now have some data indicate closure of PFO with recurrent stroke is indicated, however she has done well without recurrent episodes since 2015 and I would not recommend pursuing closure at this time.  She will remain on full dose aspirin.  We will contact her with the results of her lipid profile plan follow-up annually or sooner as necessary.  Chrystie NoseKenneth C. Cindra Austad, MD, [facility] Center[facility]ACC, FACP  [place]  [facility]  Medical Director of the [facility]  [facility] Diplomate of the American Board of Clinical Lipidology Attending Cardiologist  Direct Dial: [phone]  Fax: [phone]  Website:  [email or web address]  Alanda Colton C Taela Charbonneau 07/09/2018, 3:14 PM

## 2018-07-09 NOTE — Patient Instructions (Addendum)
Your physician recommends that you return for lab work TODAY   Your physician wants you to follow-up in: ONE YEAR with Dr. Hilty. You will receive a reminder letter in the mail two months in advance. If you don't receive a letter, please call our office to schedule the follow-up appointment.   

## 2018-07-10 ENCOUNTER — Encounter: Payer: Self-pay | Admitting: Internal Medicine

## 2018-07-10 LAB — LIPID PANEL
Chol/HDL Ratio: 2.2 ratio (ref 0.0–4.4)
Cholesterol, Total: 168 mg/dL (ref 100–199)
HDL: 77 mg/dL (ref >39)
LDL Calculated: 80 mg/dL (ref 0–99)
TRIGLYCERIDES: 57 mg/dL (ref 0–149)
VLDL Cholesterol Cal: 11 mg/dL (ref 5–40)

## 2018-08-11 ENCOUNTER — Encounter: Payer: Self-pay | Admitting: Podiatry

## 2018-08-11 ENCOUNTER — Ambulatory Visit (INDEPENDENT_AMBULATORY_CARE_PROVIDER_SITE_OTHER): Payer: Medicare Other | Admitting: Podiatry

## 2018-08-11 DIAGNOSIS — M79674 Pain in right toe(s): Secondary | ICD-10-CM

## 2018-08-11 DIAGNOSIS — Q828 Other specified congenital malformations of skin: Secondary | ICD-10-CM | POA: Diagnosis not present

## 2018-08-11 DIAGNOSIS — B351 Tinea unguium: Secondary | ICD-10-CM | POA: Diagnosis not present

## 2018-08-11 DIAGNOSIS — M79675 Pain in left toe(s): Secondary | ICD-10-CM

## 2018-08-13 NOTE — Progress Notes (Signed)
Subjective: 84 y.o. returns the office today for painful, elongated, thickened toenails which she cannot trim herself.  Therefore painful corns, calluses on the left foot.  Denies any redness or drainage around the nails. Denies any acute changes since last appointment and no new complaints today. Denies any systemic complaints such as fevers, chills, nausea, vomiting.   PCP: Adrian Prince, MD  Objective: AAO 3, NAD DP/PT pulses palpable, CRT less than 3 seconds Nails hypertrophic, dystrophic, elongated, brittle, discolored 10. There is tenderness overlying the nails 1-5 bilaterally. There is no surrounding erythema or drainage along the nail sites. Hyperkeratotic lesions present left second PIPJ as well as left submetatarsal 5.  Upon debridement there is no underlying ulceration, drainage or any signs of infection. No open lesions or pre-ulcerative lesions are identified. No other areas of tenderness bilateral lower extremities. No overlying edema, erythema, increased warmth. No pain with calf compression, swelling, warmth, erythema.  Assessment: Patient presents with symptomatic onychomycosis, hyperkeratotic lesions  Plan: -Treatment options including alternatives, risks, complications were discussed -Nails sharply debrided 10 without complication/bleeding. -Hyperkeratotic lesion sharply debrided x2 without any complications or bleeding. -Discussed daily foot inspection. If there are any changes, to call the office immediately.  -Follow-up in 3 months or sooner if any problems are to arise. In the meantime, encouraged to call the office with any questions, concerns, changes symptoms.  Ovid Curd, DPM

## 2018-09-05 ENCOUNTER — Other Ambulatory Visit: Payer: Self-pay | Admitting: Endocrinology

## 2018-09-05 ENCOUNTER — Ambulatory Visit
Admission: RE | Admit: 2018-09-05 | Discharge: 2018-09-05 | Disposition: A | Discharge status: Home / Self Care | Payer: Medicare Other | Source: Ambulatory Visit | Attending: Endocrinology | Admitting: Endocrinology

## 2018-09-05 DIAGNOSIS — J3489 Other specified disorders of nose and nasal sinuses: Secondary | ICD-10-CM

## 2018-09-15 IMAGING — RF DG UGI W/ HIGH DENSITY W/KUB
12 of 19 series · 12 of 24 positions shown · non-contrast
Comparison: none

CLINICAL DATA: Unexplained weight loss.  Reflux.

EXAM:
UPPER GI SERIES WITH KUB
TECHNIQUE: After obtaining a scout radiograph a routine upper GI series was
performed using thin and high density barium.
FLUOROSCOPY TIME:  Fluoroscopy Time:  4 minutes, 18 seconds
Radiation Exposure Index (if provided by the fluoroscopic device):
26.4 mGy
Number of Acquired Spot Images: 4

[Series 2: cp_standard · 0.39mm/px · 1 of 27 frames shown (1 of 11)]
[frame 14/27]
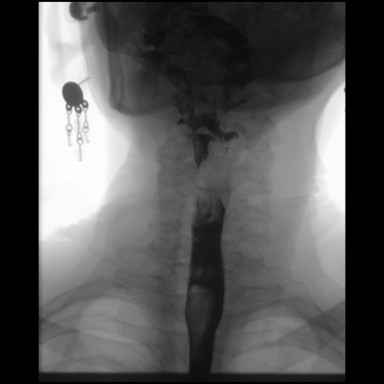

[Series 3: cp_standard · 0.36mm/px · 1 of 60 frames shown (2 of 11)]
[frame 52/60]
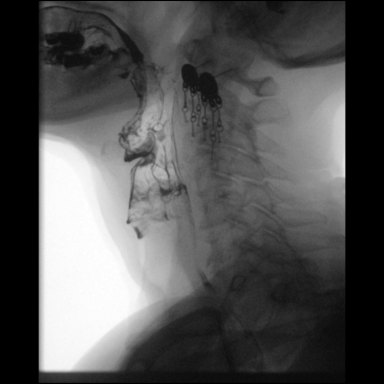

[Series 5: cp_standard · 0.37mm/px · 1 of 164 frames shown (3 of 11)]
[frame 5/164]
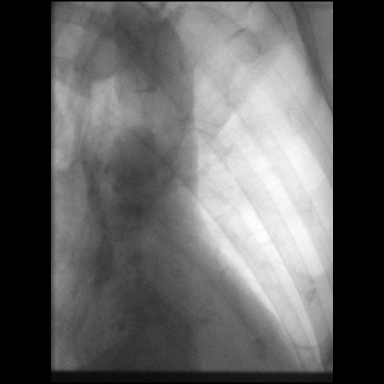

[Series 6: cp_standard · 0.38mm/px · 1 of 83 frames shown (4 of 11)]
[frame 71/83]
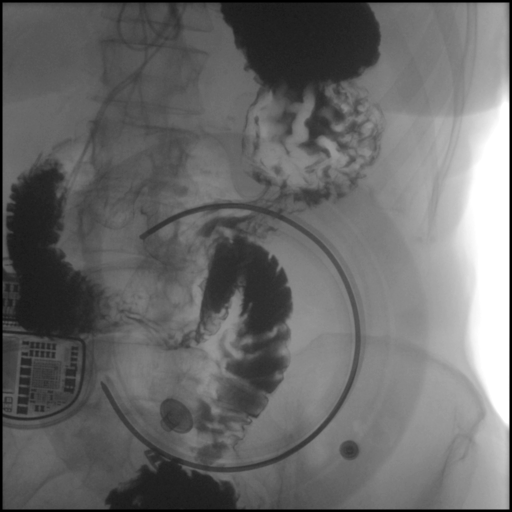

[Series 10: fluoro_barium 2fps_bw · 0.18mm/px · 1 of 1 slices shown]
[im 1/1]
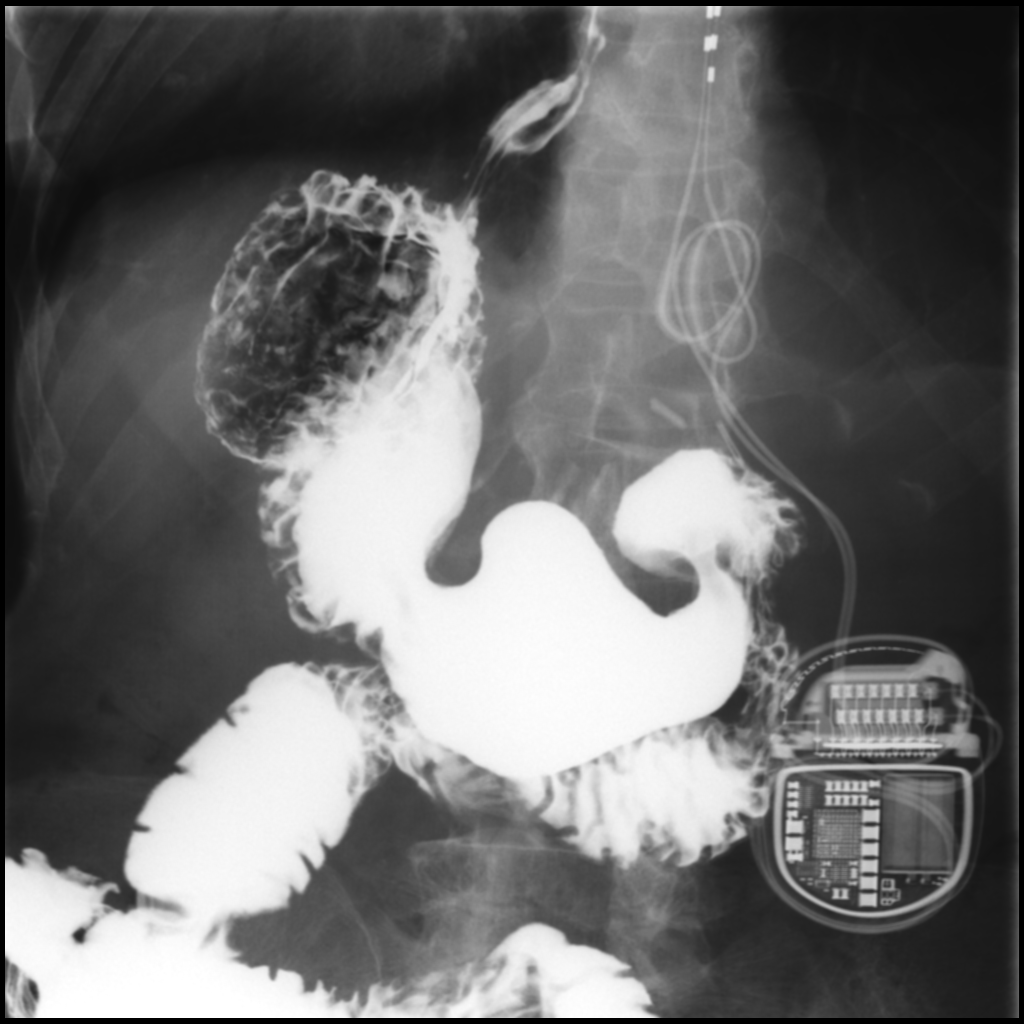

[Series 12: cp_standard · 0.36mm/px · 1 of 57 frames shown (5 of 11)]
[frame 49/57]
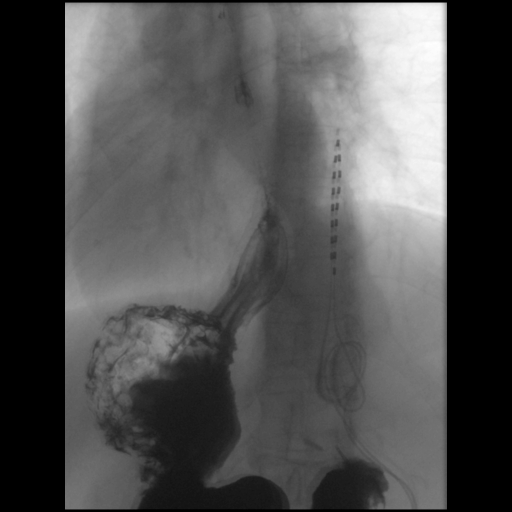

[Series 14: cp_standard · 0.36mm/px · 1 of 28 frames shown (6 of 11)]
[frame 13/28]
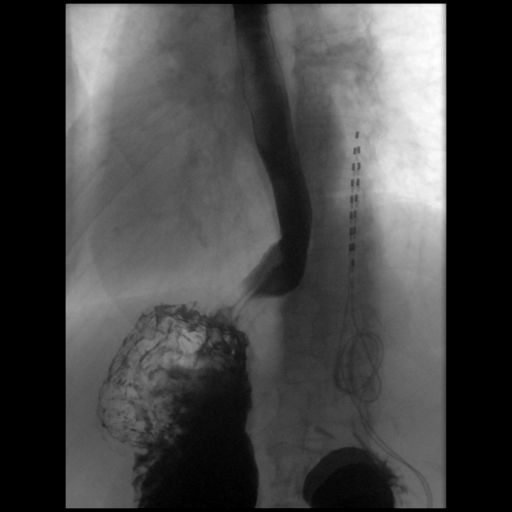

[Series 15: cp_standard · 0.36mm/px · 1 of 54 frames shown (7 of 11)]
[frame 28/54]
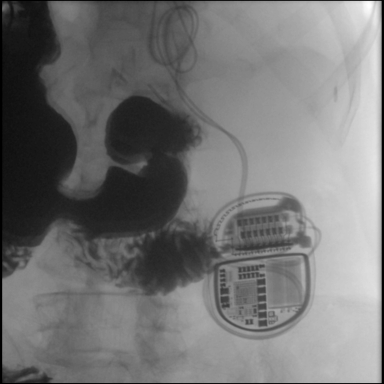

[Series 17: cp_standard · 0.37mm/px · 1 of 19 frames shown (8 of 11)]
[frame 3/19]
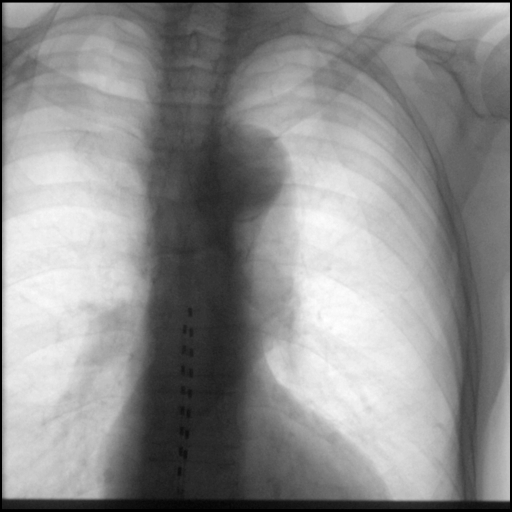

[Series 18: cp_standard · 0.37mm/px · 1 of 29 frames shown (9 of 11)]
[frame 15/29]
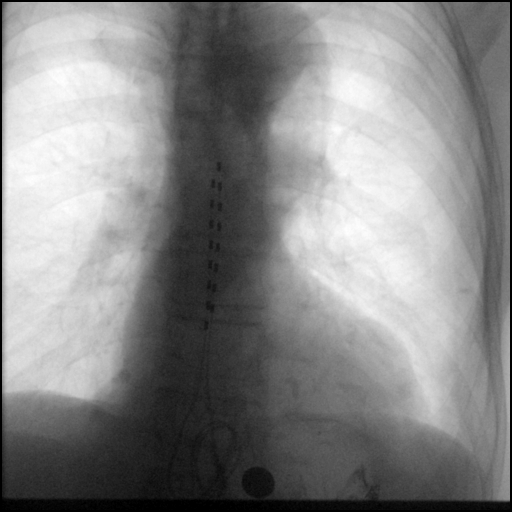

[Series 20: cp_standard · 0.37mm/px · 1 of 2 frames shown (10 of 11)]
[frame 1/2]
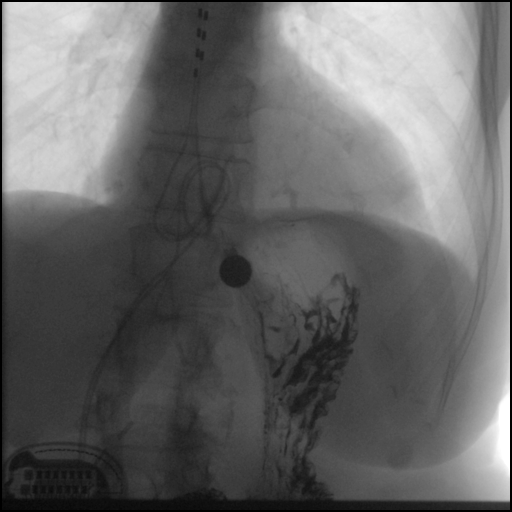

[Series 21: cp_standard · 0.37mm/px · 1 of 25 frames shown (11 of 11)]
[frame 25/25]
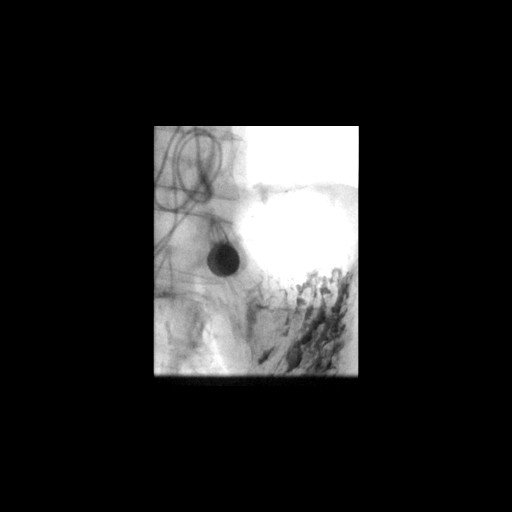

[12 of 24 positions shown; findings below may reference images not displayed]

FINDINGS: Initial KUB demonstrates dorsal column stimulator and evidence of
prior lower lumbar laminectomies. Mild dextroconvex lumbar
scoliosis. Unremarkable bowel gas pattern.

The pharyngeal phase of swallowing demonstrated silent aspiration
just past the cords. Mild residue in the vallecula and piriform
sinuses. Slightly delayed epiglottic inversion.

There is mild smooth narrowing of the distal esophagus down to 14 mm
as shown on image 59/5. A 13 mm barium tablet did impact in this
vicinity for 2 minutes. No irregularity noted. Some of this smaller
size may be constitutional given the patient's overall small body
habitus.

Normal configuration of the stomach and duodenum. Mild
gastroesophageal reflux was noted during the course of today's exam.
Equivocal distal esophageal fold thickening.

Primary peristaltic waves in the esophagus were intact on [DATE]
swallows.

Aortoiliac atherosclerotic vascular disease.
IMPRESSION: 1. Silent aspiration with swallowing of thick barium.
2. Gastroesophageal reflux.
3. Smooth narrowing in the distal esophagus, and which a 13 mm
barium tablet impacted. The entire esophagus is mildly small and
some of this appearance may simply be due to the patient's overall
small size/frame, but the possibility of a smooth stricture is not
completely excluded in the distal esophagus. Most malignant
strictures would appear more irregular than the esophagus appears in
this case, but continued food impaction may warrant endoscopy.

## 2018-10-21 ENCOUNTER — Ambulatory Visit (INDEPENDENT_AMBULATORY_CARE_PROVIDER_SITE_OTHER): Payer: Medicare Other | Admitting: Podiatry

## 2018-10-21 DIAGNOSIS — M79675 Pain in left toe(s): Secondary | ICD-10-CM | POA: Diagnosis not present

## 2018-10-21 DIAGNOSIS — M79674 Pain in right toe(s): Secondary | ICD-10-CM

## 2018-10-21 DIAGNOSIS — B351 Tinea unguium: Secondary | ICD-10-CM | POA: Diagnosis not present

## 2018-10-21 DIAGNOSIS — Q828 Other specified congenital malformations of skin: Secondary | ICD-10-CM | POA: Diagnosis not present

## 2018-10-23 NOTE — Progress Notes (Signed)
Subjective: 84 y.o. returns the office today for painful, elongated, thickened toenails which she cannot trim herself as well as for calluses.  Denies any redness or drainage around the nails/calluses. Denies any acute changes since last appointment and no new complaints today. Denies any systemic complaints such as fevers, chills, nausea, vomiting.   PCP: Adrian PrinceSouth, Stephen, MD  Objective: AAO 3, NAD DP/PT pulses palpable, CRT less than 3 seconds Nails hypertrophic, dystrophic, elongated, brittle, discolored 10. There is tenderness overlying the nails 1-5 bilaterally. There is no surrounding erythema or drainage along the nail sites. Hyperkeratotic lesions present left second PIPJ as well as left submetatarsal 5.  Upon debridement there is no underlying ulceration, drainage or any signs of infection. No open lesions or other pre-ulcerative lesions are identified. No pain with calf compression, swelling, warmth, erythema. No other changes.   Assessment: Patient presents with symptomatic onychomycosis, hyperkeratotic lesions  Plan: -Treatment options including alternatives, risks, complications were discussed -Nails sharply debrided 10 without complication/bleeding. -Hyperkeratotic lesion sharply debrided x2 without any complications or bleeding. -Discussed daily foot inspection. If there are any changes, to call the office immediately.  -Follow-up in 3 months or sooner if any problems are to arise. In the meantime, encouraged to call the office with any questions, concerns, changes symptoms.  Ovid CurdMatthew Ren Aspinall, DPM

## 2018-11-10 ENCOUNTER — Ambulatory Visit: Payer: Medicare Other | Admitting: Podiatry

## 2018-11-11 ENCOUNTER — Other Ambulatory Visit: Payer: Self-pay | Admitting: *Deleted

## 2018-11-11 MED ORDER — VERAPAMIL HCL 120 MG PO TABS: 120.0000 mg | ORAL_TABLET | Freq: Every day | ORAL | 2 refills | Status: DC

## 2018-11-12 ENCOUNTER — Other Ambulatory Visit: Payer: Self-pay

## 2018-11-14 ENCOUNTER — Other Ambulatory Visit: Payer: Self-pay | Admitting: *Deleted

## 2018-11-17 ENCOUNTER — Telehealth: Payer: Self-pay | Admitting: Internal Medicine

## 2018-11-17 MED ORDER — VERAPAMIL HCL ER 120 MG PO TBCR: 120.0000 mg | EXTENDED_RELEASE_TABLET | Freq: Every day | ORAL | 3 refills | Status: DC

## 2018-11-17 NOTE — Telephone Encounter (Signed)
Spoke with patient and she has not received Verapamil 120 mg at bedtime that was to be sent to [facility]. She spoke with them this am and was told they had been trying to reach office. Per [facility] patient has been getting Verapamil 12 hour 120 mg hs and 12 hour 240 mg in am. These were given by previous cardiologist. Per Eileen StanfordJenna RN with Dr Rennis GoldenHilty no changes have been made. Called in Verapamil SR 120 mg each evening and corrected in Epic. Patient aware Rx called in

## 2018-11-17 NOTE — Telephone Encounter (Signed)
° ° °  Patient states the pharmacy has questions regarding verapamil (CALAN) 120 MG tablet Phone [phone]   Patient states she is unsure why the pharmacy will not refill medication

## 2018-12-30 ENCOUNTER — Ambulatory Visit (INDEPENDENT_AMBULATORY_CARE_PROVIDER_SITE_OTHER): Payer: Medicare Other | Admitting: Podiatry

## 2018-12-30 DIAGNOSIS — B351 Tinea unguium: Secondary | ICD-10-CM

## 2018-12-30 DIAGNOSIS — M79675 Pain in left toe(s): Secondary | ICD-10-CM | POA: Diagnosis not present

## 2018-12-30 DIAGNOSIS — D689 Coagulation defect, unspecified: Secondary | ICD-10-CM | POA: Diagnosis not present

## 2018-12-30 DIAGNOSIS — M79674 Pain in right toe(s): Secondary | ICD-10-CM

## 2018-12-30 DIAGNOSIS — Z9229 Personal history of other drug therapy: Secondary | ICD-10-CM

## 2018-12-30 DIAGNOSIS — L84 Corns and callosities: Secondary | ICD-10-CM | POA: Diagnosis not present

## 2018-12-30 NOTE — Patient Instructions (Signed)

## 2019-01-07 ENCOUNTER — Other Ambulatory Visit (HOSPITAL_COMMUNITY): Payer: Self-pay | Admitting: *Deleted

## 2019-01-08 ENCOUNTER — Encounter (HOSPITAL_COMMUNITY)
Admission: RE | Admit: 2019-01-08 | Discharge: 2019-01-08 | Disposition: A | Discharge status: Home / Self Care | Payer: Medicare Other | Source: Ambulatory Visit | Attending: Endocrinology | Admitting: Endocrinology

## 2019-01-08 ENCOUNTER — Encounter (HOSPITAL_COMMUNITY): Payer: Medicare Other

## 2019-01-08 DIAGNOSIS — M810 Age-related osteoporosis without current pathological fracture: Secondary | ICD-10-CM | POA: Insufficient documentation

## 2019-01-08 MED ORDER — DENOSUMAB 60 MG/ML ~~LOC~~ SOSY
60.0000 mg | PREFILLED_SYRINGE | Freq: Once | SUBCUTANEOUS | Status: AC
  Administered 2019-01-08: 60 mg via SUBCUTANEOUS

## 2019-01-08 MED ORDER — DENOSUMAB 60 MG/ML ~~LOC~~ SOSY
PREFILLED_SYRINGE | SUBCUTANEOUS | Status: AC
  Administered 2019-01-08: 60 mg via SUBCUTANEOUS
  Filled 2019-01-08: qty 1

## 2019-01-09 ENCOUNTER — Encounter: Payer: Self-pay | Admitting: Podiatry

## 2019-01-09 NOTE — Progress Notes (Signed)
Subjective: Jill Mcdonald is a 84 y.o. y.o. female who presents today with painful, discolored, thick toenails  which interfere with daily activities. Pain is aggravated when wearing enclosed shoe gear. Pain is relieved with periodic professional debridement.   Patient's PCP is [place], Jeannett Senior, MD.   Current Outpatient Medications:  .  aspirin 325 MG tablet, Take 325 mg by mouth daily., Disp: , Rfl:  .  Biotin 1000 MCG tablet, Take 1,000 mcg by mouth 3 (three) times daily., Disp: , Rfl:  .  clobetasol ointment (TEMOVATE) 0.05 %, Apply 1 application topically as needed., Disp: , Rfl:  .  dicyclomine (BENTYL) 10 MG capsule, Take 10 mg by mouth 2 (two) times daily as needed for spasms., Disp: , Rfl:  .  fluticasone (FLONASE) 50 MCG/ACT nasal spray, U 2 SPRAYS ONCE IEN D, Disp: , Rfl: 1 .  gabapentin (NEURONTIN) 300 MG capsule, Take 600 mg by mouth 2 (two) times daily., Disp: , Rfl:  .  gemfibrozil (LOPID) 600 MG tablet, Take 600 mg by mouth 2 (two) times daily before a meal., Disp: , Rfl:  .  gentamicin ointment (GARAMYCIN) 0.1 %, Apply 1 application topically 2 (two) times daily., Disp: , Rfl:  .  levofloxacin (LEVAQUIN) 500 MG tablet, TK 1 T PO D FOR 7 DAYS, Disp: , Rfl: 0 .  predniSONE (DELTASONE) 5 MG tablet, TK 1/2 T PO D FOR 4 DAYS, Disp: , Rfl: 0 .  ranitidine (ZANTAC) 150 MG tablet, Take 150 mg by mouth daily as needed for heartburn., Disp: , Rfl:  .  ranitidine (ZANTAC) 300 MG tablet, Take 1 tablet (300 mg total) by mouth at bedtime., Disp: 90 tablet, Rfl: 3 .  sucralfate (CARAFATE) 1 g tablet, Take 1 tablet (1 g total) by mouth 2 (two) times daily. Melt into 10 ml of water and take by mouth when dissolved, Disp: 60 tablet, Rfl: 3 .  tiZANidine (ZANAFLEX) 4 MG capsule, Take 4 mg by mouth 3 (three) times daily as needed for muscle spasms., Disp: , Rfl:  .  traMADol (ULTRAM) 50 MG tablet, Take 50 mg by mouth every 6 (six) hours as needed for moderate pain., Disp: , Rfl:  .   triamcinolone ointment (KENALOG) 0.1 %, , Disp: , Rfl:  .  verapamil (CALAN-SR) 120 MG CR tablet, Take 1 tablet (120 mg total) by mouth at bedtime., Disp: 90 tablet, Rfl: 3 .  verapamil (CALAN-SR) 240 MG CR tablet, Take 240 mg by mouth every morning., Disp: , Rfl:  .  Vitamin D, Ergocalciferol, (DRISDOL) 50000 units CAPS capsule, Take 50,000 Units by mouth daily., Disp: , Rfl:  .  zolpidem (AMBIEN) 5 MG tablet, Take 5 mg by mouth at bedtime as needed for sleep., Disp: , Rfl:  .  ezetimibe (ZETIA) 10 MG tablet, Take 1 tablet (10 mg total) by mouth daily., Disp: 90 tablet, Rfl: 3  Current Facility-Administered Medications:  .  0.9 %  sodium chloride infusion, 500 mL, Intravenous, Once, Nandigam, Eleonore Chiquito, MD   Allergies  Allergen Reactions  . Nitrofuran Derivatives Shortness Of Breath, Nausea And Vomiting and Palpitations    Sweating, coldness, weakness in legs, lightheadedness, no appetite  . Zyvox [Linezolid] Rash  . Amlodipine Besylate Hives and Nausea Only    Nightmares   . Amoxicillin Hives  . Buspirone     Dizziness   . Cefotetan Hives  . Cephalosporins Hives and Diarrhea  . Ciprofloxacin   . Cocamide Hives and Diarrhea  . Codeine Diarrhea and  Nausea And Vomiting  . Cyclobenzaprine Diarrhea and Nausea And Vomiting  . Diclofenac     Dizziness, blurred vision and ringing in the ears.   . Latex Hives  . Macrobid [Nitrofurantoin Macrocrystal] Nausea And Vomiting  . Metronidazole   . Norvasc [Amlodipine] Hives, Diarrhea and Nausea And Vomiting  . Paroxetine   . Triple Antibiotic [Bacitracin-Neomycin-Polymyxin]   . Valdecoxib   . Zocor [Simvastatin]     Headache, migraine  . Adhesive [Tape] Rash  . Anesthetics, Amide Rash  . Bactrim [Sulfamethoxazole-Trimethoprim] Nausea Only and Rash  . Cefuroxime Axetil Hives, Nausea And Vomiting and Rash    Gi upset   . Celecoxib Hives and Rash  . Clarithromycin Nausea Only and Rash  . Coreg [Carvedilol] Hives and Rash  . Crestor  [Rosuvastatin Calcium] Rash  . Doxycycline Diarrhea and Rash  . Lipitor [Atorvastatin Calcium] Hives and Rash    headache   . Neomycin-Bacitracin Zn-Polymyx Hives and Rash  . Neosporin [Neomycin-Polymyxin-Gramicidin] Hives and Rash  . Penicillins Diarrhea, Nausea And Vomiting and Rash  . Plavix [Clopidogrel] Nausea And Vomiting and Rash  . Prochlorperazine Edisylate Hives and Rash  . Valtrex [Valacyclovir Hcl] Hives, Diarrhea, Nausea And Vomiting and Rash  . Vytorin [Ezetimibe-Simvastatin] Hives, Diarrhea, Nausea And Vomiting and Rash    Muscle pain and cramps  . Zofran [Ondansetron Hcl] Hives and Rash     Objective: Vascular Examination: Capillary refill time <3 seconds x 10 digits Dorsalis pedis pulses palpable b/l Posterior tibial pulses palpable b/l No digital hair x 10 digits Skin temperature gradient WNL b/l  Dermatological Examination:  Skin atrophic b/l LE  Toenails 1-5 b/l discolored, thick, dystrophic with subungual debris and pain with palpation to nailbeds due to thickness of nails.  Hyperkeratotic lesion medial 2nd PIPJ, submet head 1, 5 b/l. No erythema, no edema, no drainage, no flocculence.  Musculoskeletal: Muscle strength 5/5 to all LE muscle groups  Hammertoes 2-5 b/l  Neurological: Sensation intact with 10 gram monofilament. Vibratory sensation intact.  Assessment: Painful onychomycosis toenails 1-5 b/l in patient on blood thinner.  Calluses b/l submet head 1, 5 b/l Corn medial left 2nd PIPJ Coagulopathy on long term blood thinner  Plan: 1. Toenails 1-5 b/l were debrided in length and girth without iatrogenic bleeding. 2. Calluses b/l submet heads 1, 5 b/l and corn left 2nd toe pared without incident. 3. Patient to continue soft, supportive shoe gear 4. Patient to report any pedal injuries to medical professional immediately. 5. Avoid self trimming due to use of blood thinner. 6. Follow up 3 months. Patient/POA to call should there be a concern  in the interim.

## 2019-01-22 ENCOUNTER — Ambulatory Visit: Payer: Medicare Other | Admitting: Podiatry

## 2019-02-13 ENCOUNTER — Other Ambulatory Visit: Payer: Self-pay | Admitting: Internal Medicine

## 2019-03-03 ENCOUNTER — Ambulatory Visit: Payer: Medicare Other | Admitting: Podiatry

## 2019-03-03 ENCOUNTER — Other Ambulatory Visit: Payer: Self-pay

## 2019-03-03 ENCOUNTER — Encounter: Payer: Self-pay | Admitting: Podiatry

## 2019-03-03 ENCOUNTER — Other Ambulatory Visit: Payer: Self-pay | Admitting: *Deleted

## 2019-03-03 ENCOUNTER — Telehealth: Payer: Self-pay | Admitting: *Deleted

## 2019-03-03 VITALS — Temp 97.7°F

## 2019-03-03 DIAGNOSIS — L84 Corns and callosities: Secondary | ICD-10-CM

## 2019-03-03 DIAGNOSIS — D689 Coagulation defect, unspecified: Secondary | ICD-10-CM

## 2019-03-03 DIAGNOSIS — M79674 Pain in right toe(s): Secondary | ICD-10-CM

## 2019-03-03 DIAGNOSIS — M79675 Pain in left toe(s): Secondary | ICD-10-CM | POA: Diagnosis not present

## 2019-03-03 DIAGNOSIS — B351 Tinea unguium: Secondary | ICD-10-CM

## 2019-03-03 MED ORDER — OMEPRAZOLE 40 MG PO CPDR: 40.0000 mg | DELAYED_RELEASE_CAPSULE | Freq: Every day | ORAL | 3 refills | Status: AC

## 2019-03-03 NOTE — Telephone Encounter (Signed)
Refilled patients omeprazole per refill request from pharmacy

## 2019-03-03 NOTE — Progress Notes (Signed)
Subjective: Jill Mcdonald is a 85 y.o. y.o. female who presents today with cc of painful, discolored, thick toenails and painful calluses and corn which interfere with daily activities. Pain is aggravated when wearing enclosed shoe gear and relieved with periodic professional debridement.  Adrian Prince, MD is her PCP. Last visit 01/08/2019.   Current Outpatient Medications:  .  aspirin 325 MG tablet, Take 325 mg by mouth daily., Disp: , Rfl:  .  Biotin 1000 MCG tablet, Take 1,000 mcg by mouth 3 (three) times daily., Disp: , Rfl:  .  clobetasol ointment (TEMOVATE) 0.05 %, Apply 1 application topically as needed., Disp: , Rfl:  .  dicyclomine (BENTYL) 10 MG capsule, Take 10 mg by mouth 2 (two) times daily as needed for spasms., Disp: , Rfl:  .  ezetimibe (ZETIA) 10 MG tablet, Take 1 tablet (10 mg total) by mouth daily., Disp: 90 tablet, Rfl: 0 .  fluticasone (FLONASE) 50 MCG/ACT nasal spray, U 2 SPRAYS ONCE IEN D, Disp: , Rfl: 1 .  gabapentin (NEURONTIN) 300 MG capsule, Take 600 mg by mouth 2 (two) times daily., Disp: , Rfl:  .  gemfibrozil (LOPID) 600 MG tablet, Take 600 mg by mouth 2 (two) times daily before a meal., Disp: , Rfl:  .  gentamicin ointment (GARAMYCIN) 0.1 %, Apply 1 application topically 2 (two) times daily., Disp: , Rfl:  .  levofloxacin (LEVAQUIN) 500 MG tablet, TK 1 T PO D FOR 7 DAYS, Disp: , Rfl: 0 .  omeprazole (PRILOSEC) 40 MG capsule, Take 1 capsule (40 mg total) by mouth daily., Disp: 90 capsule, Rfl: 3 .  predniSONE (DELTASONE) 5 MG tablet, TK 1/2 T PO D FOR 4 DAYS, Disp: , Rfl: 0 .  ranitidine (ZANTAC) 150 MG tablet, Take 150 mg by mouth daily as needed for heartburn., Disp: , Rfl:  .  ranitidine (ZANTAC) 300 MG tablet, Take 1 tablet (300 mg total) by mouth at bedtime., Disp: 90 tablet, Rfl: 3 .  sucralfate (CARAFATE) 1 g tablet, Take 1 tablet (1 g total) by mouth 2 (two) times daily. Melt into 10 ml of water and take by mouth when dissolved, Disp: 60 tablet, Rfl:  3 .  tiZANidine (ZANAFLEX) 4 MG capsule, Take 4 mg by mouth 3 (three) times daily as needed for muscle spasms., Disp: , Rfl:  .  traMADol (ULTRAM) 50 MG tablet, Take 50 mg by mouth every 6 (six) hours as needed for moderate pain., Disp: , Rfl:  .  triamcinolone ointment (KENALOG) 0.1 %, , Disp: , Rfl:  .  verapamil (CALAN-SR) 120 MG CR tablet, Take 1 tablet (120 mg total) by mouth at bedtime., Disp: 90 tablet, Rfl: 3 .  verapamil (CALAN-SR) 240 MG CR tablet, Take 240 mg by mouth every morning., Disp: , Rfl:  .  Vitamin D, Ergocalciferol, (DRISDOL) 50000 units CAPS capsule, Take 50,000 Units by mouth daily., Disp: , Rfl:  .  zolpidem (AMBIEN) 5 MG tablet, Take 5 mg by mouth at bedtime as needed for sleep., Disp: , Rfl:   Current Facility-Administered Medications:  .  0.9 %  sodium chloride infusion, 500 mL, Intravenous, Once, Nandigam, Eleonore Chiquito, MD  Allergies  Allergen Reactions  . Nitrofuran Derivatives Shortness Of Breath, Nausea And Vomiting and Palpitations    Sweating, coldness, weakness in legs, lightheadedness, no appetite  . Zyvox [Linezolid] Rash  . Amlodipine Besylate Hives and Nausea Only    Nightmares   . Amoxicillin Hives  . Buspirone  Dizziness   . Cefotetan Hives  . Cephalosporins Hives and Diarrhea  . Ciprofloxacin   . Cocamide Hives and Diarrhea  . Codeine Diarrhea and Nausea And Vomiting  . Cyclobenzaprine Diarrhea and Nausea And Vomiting  . Diclofenac     Dizziness, blurred vision and ringing in the ears.   . Latex Hives  . Macrobid [Nitrofurantoin Macrocrystal] Nausea And Vomiting  . Metronidazole   . Norvasc [Amlodipine] Hives, Diarrhea and Nausea And Vomiting  . Paroxetine   . Triple Antibiotic [Bacitracin-Neomycin-Polymyxin]   . Valdecoxib   . Zocor [Simvastatin]     Headache, migraine  . Adhesive [Tape] Rash  . Anesthetics, Amide Rash  . Bactrim [Sulfamethoxazole-Trimethoprim] Nausea Only and Rash  . Cefuroxime Axetil Hives, Nausea And Vomiting  and Rash    Gi upset   . Celecoxib Hives and Rash  . Clarithromycin Nausea Only and Rash  . Coreg [Carvedilol] Hives and Rash  . Crestor [Rosuvastatin Calcium] Rash  . Doxycycline Diarrhea and Rash  . Lipitor [Atorvastatin Calcium] Hives and Rash    headache   . Neomycin-Bacitracin Zn-Polymyx Hives and Rash  . Neosporin [Neomycin-Polymyxin-Gramicidin] Hives and Rash  . Penicillins Diarrhea, Nausea And Vomiting and Rash  . Plavix [Clopidogrel] Nausea And Vomiting and Rash  . Prochlorperazine Edisylate Hives and Rash  . Valtrex [Valacyclovir Hcl] Hives, Diarrhea, Nausea And Vomiting and Rash  . Vytorin [Ezetimibe-Simvastatin] Hives, Diarrhea, Nausea And Vomiting and Rash    Muscle pain and cramps  . Zofran [Ondansetron Hcl] Hives and Rash    Objective:  Vascular Examination: Capillary refill time <3 seconds  x 10 digits.  Dorsalis pedis pulses palpable b/l.  Posterior tibial pulses palpable b/l.  Digital hair absent x 10 digits.  Skin temperature gradient WNL b/l.  Dermatological Examination: Skin thin and atrophic b/l LE.  Toenails 1-5 b/l discolored, thick, dystrophic with subungual debris and pain with palpation to nailbeds due to thickness of nails.  Hyperkeratotic lesion medial 2nd PIPJ left, submet head 1, 5 b/l. No erythema, no edema, no drainage, no flocculence noted.   Musculoskeletal: Muscle strength 5/5 to all LE muscle groups.  Hammertoes 2-5 b/l   Neurological: Sensation intact 5/5 b/l with 10 gram monofilament.  Vibratory sensation intact b/l.  Assessment: 1. Painful onychomycosis toenails 1-5 b/l 2.  Callus submet head 1, 5 b/l 3.  Corn medial left 2nd digit PIPJ   Plan:  1. Toenails 1-5 b/l were debrided in length and girth without iatrogenic bleeding.Hyperkeratotic lesion(s)  pared with sterile scalpel blade without incident. Calluses pared submetatarsal head(s) 1, 5 b/l utilizing sterile scalpel blade without incident.  Corn(s) pared left  2nd utilizing sterile scalpel blade and gently filed with burr without incident. Antibiotic ointment applied. Bandaid applied for padding. Continue daily padding between toes for comfort. 2. Patient to continue soft, supportive shoe gear daily. 3. Patient to report any pedal injuries to medical professional immediately. 4. Follow up 9 weeks.  5. Patient/POA to call should there be a concern in the interim.

## 2019-04-20 ENCOUNTER — Other Ambulatory Visit: Payer: Self-pay

## 2019-04-20 ENCOUNTER — Encounter: Payer: Self-pay | Admitting: Podiatry

## 2019-04-20 ENCOUNTER — Ambulatory Visit: Payer: Medicare Other | Admitting: Podiatry

## 2019-04-20 VITALS — Temp 98.4°F

## 2019-04-20 DIAGNOSIS — L84 Corns and callosities: Secondary | ICD-10-CM

## 2019-04-20 DIAGNOSIS — M79675 Pain in left toe(s): Secondary | ICD-10-CM | POA: Diagnosis not present

## 2019-04-20 DIAGNOSIS — B351 Tinea unguium: Secondary | ICD-10-CM

## 2019-04-20 DIAGNOSIS — M79674 Pain in right toe(s): Secondary | ICD-10-CM

## 2019-04-20 NOTE — Patient Instructions (Signed)
Onychomycosis/Fungal Toenails  WHAT IS IT? An infection that lies within the keratin of your nail plate that is caused by a fungus.  WHY ME? Fungal infections affect all ages, sexes, races, and creeds.  There may be many factors that predispose you to a fungal infection such as age, coexisting medical conditions such as diabetes, or an autoimmune disease; stress, medications, fatigue, genetics, etc.  Bottom line: fungus thrives in a warm, moist environment and your shoes offer such a location.  IS IT CONTAGIOUS? Theoretically, yes.  You do not want to share shoes, nail clippers or files with someone who has fungal toenails.  Walking around barefoot in the same room or sleeping in the same bed is unlikely to transfer the organism.  It is important to realize, however, that fungus can spread easily from one nail to the next on the same foot.  HOW DO WE TREAT THIS?  There are several ways to treat this condition.  Treatment may depend on many factors such as age, medications, pregnancy, liver and kidney conditions, etc.  It is best to ask your doctor which options are available to you.  1. No treatment.   Unlike many other medical concerns, you can live with this condition.  However for many people this can be a painful condition and may lead to ingrown toenails or a bacterial infection.  It is recommended that you keep the nails cut short to help reduce the amount of fungal nail. 2. Topical treatment.  These range from herbal remedies to prescription strength nail lacquers.  About 40-50% effective, topicals require twice daily application for approximately 9 to 12 months or until an entirely new nail has grown out.  The most effective topicals are medical grade medications available through physicians offices. 3. Oral antifungal medications.  With an 80-90% cure rate, the most common oral medication requires 3 to 4 months of therapy and stays in your system for a year as the new nail grows out.  Oral  antifungal medications do require blood work to make sure it is a safe drug for you.  A liver function panel will be performed prior to starting the medication and after the first month of treatment.  It is important to have the blood work performed to avoid any harmful side effects.  In general, this medication safe but blood work is required. 4. Laser Therapy.  This treatment is performed by applying a specialized laser to the affected nail plate.  This therapy is noninvasive, fast, and non-painful.  It is not covered by insurance and is therefore, out of pocket.  The results have been very good with a 80-95% cure rate.  [facility] is the only practice in the area to offer this therapy. 5. Permanent Nail Avulsion.  Removing the entire nail so that a new nail will not grow back.  Corns and Calluses Corns are small areas of thickened skin that occur on the top, sides, or tip of a toe. They contain a cone-shaped core with a point that can press on a nerve below. This causes pain.  Calluses are areas of thickened skin that can occur anywhere on the body, including the hands, fingers, palms, soles of the feet, and heels. Calluses are usually larger than corns. What are the causes? Corns and calluses are caused by rubbing (friction) or pressure, such as from shoes that are too tight or do not fit properly. What increases the risk? Corns are more likely to develop in people   who have misshapen toes (toe deformities), such as hammer toes. Calluses can occur with friction to any area of the skin. They are more likely to develop in people who:  Work with their hands.  Wear shoes that fit poorly, are too tight, or are high-heeled.  Have toe deformities. What are the signs or symptoms? Symptoms of a corn or callus include:  A hard growth on the skin.  Pain or tenderness under the skin.  Redness and swelling.  Increased discomfort while wearing tight-fitting shoes, if your feet are  affected. If a corn or callus becomes infected, symptoms may include:  Redness and swelling that gets worse.  Pain.  Fluid, blood, or pus draining from the corn or callus. How is this diagnosed? Corns and calluses may be diagnosed based on your symptoms, your medical history, and a physical exam. How is this treated? Treatment for corns and calluses may include:  Removing the cause of the friction or pressure. This may involve: ? Changing your shoes. ? Wearing shoe inserts (orthotics) or other protective layers in your shoes, such as a corn pad. ? Wearing gloves.  Applying medicine to the skin (topical medicine) to help soften skin in the hardened, thickened areas.  Removing layers of dead skin with a file to reduce the size of the corn or callus.  Removing the corn or callus with a scalpel or laser.  Taking antibiotic medicines, if your corn or callus is infected.  Having surgery, if a toe deformity is the cause. Follow these instructions at home:   Take over-the-counter and prescription medicines only as told by your health care provider.  If you were prescribed an antibiotic, take it as told by your health care provider. Do not stop taking it even if your condition starts to improve.  Wear shoes that fit well. Avoid wearing high-heeled shoes and shoes that are too tight or too loose.  Wear any padding, protective layers, gloves, or orthotics as told by your health care provider.  Soak your hands or feet and then use a file or pumice stone to soften your corn or callus. Do this as told by your health care provider.  Check your corn or callus every day for symptoms of infection. Contact a health care provider if you:  Notice that your symptoms do not improve with treatment.  Have redness or swelling that gets worse.  Notice that your corn or callus becomes painful.  Have fluid, blood, or pus coming from your corn or callus.  Have new symptoms. Summary  Corns are  small areas of thickened skin that occur on the top, sides, or tip of a toe.  Calluses are areas of thickened skin that can occur anywhere on the body, including the hands, fingers, palms, and soles of the feet. Calluses are usually larger than corns.  Corns and calluses are caused by rubbing (friction) or pressure, such as from shoes that are too tight or do not fit properly.  Treatment may include wearing any padding, protective layers, gloves, or orthotics as told by your health care provider. This information is not intended to replace advice given to you by your health care provider. Make sure you discuss any questions you have with your health care provider. Document Released: 08/25/2004 Document Revised: 10/02/2017 Document Reviewed: 10/02/2017 [employer] Interactive Patient Education  2019 [employer]  

## 2019-04-27 ENCOUNTER — Encounter: Payer: Self-pay | Admitting: Podiatry

## 2019-04-27 NOTE — Progress Notes (Signed)
Subjective: Jill DevoidSheila M Rudge presents to clinic with cc of painful mycotic toenails, calluses and corn. Pain is  aggravated when weightbearing with and without shoe gear.  This pain limits her daily activities. Pain symptoms resolve with periodic professional debridement.  Adrian PrinceSouth, Stephen, MD is her PCP   Current Outpatient Medications:  .  aspirin 325 MG tablet, Take 325 mg by mouth daily., Disp: , Rfl:  .  Biotin 1000 MCG tablet, Take 1,000 mcg by mouth 3 (three) times daily., Disp: , Rfl:  .  clobetasol ointment (TEMOVATE) 0.05 %, Apply 1 application topically as needed., Disp: , Rfl:  .  dicyclomine (BENTYL) 10 MG capsule, Take 10 mg by mouth 2 (two) times daily as needed for spasms., Disp: , Rfl:  .  ezetimibe (ZETIA) 10 MG tablet, Take 1 tablet (10 mg total) by mouth daily., Disp: 90 tablet, Rfl: 0 .  fluticasone (FLONASE) 50 MCG/ACT nasal spray, U 2 SPRAYS ONCE IEN D, Disp: , Rfl: 1 .  gabapentin (NEURONTIN) 300 MG capsule, Take 600 mg by mouth 2 (two) times daily., Disp: , Rfl:  .  gemfibrozil (LOPID) 600 MG tablet, Take 600 mg by mouth 2 (two) times daily before a meal., Disp: , Rfl:  .  gentamicin ointment (GARAMYCIN) 0.1 %, Apply 1 application topically 2 (two) times daily., Disp: , Rfl:  .  levofloxacin (LEVAQUIN) 500 MG tablet, TK 1 T PO D FOR 7 DAYS, Disp: , Rfl: 0 .  omeprazole (PRILOSEC) 40 MG capsule, Take 1 capsule (40 mg total) by mouth daily., Disp: 90 capsule, Rfl: 3 .  predniSONE (DELTASONE) 5 MG tablet, TK 1/2 T PO D FOR 4 DAYS, Disp: , Rfl: 0 .  ranitidine (ZANTAC) 150 MG tablet, Take 150 mg by mouth daily as needed for heartburn., Disp: , Rfl:  .  ranitidine (ZANTAC) 300 MG tablet, Take 1 tablet (300 mg total) by mouth at bedtime., Disp: 90 tablet, Rfl: 3 .  sucralfate (CARAFATE) 1 g tablet, Take 1 tablet (1 g total) by mouth 2 (two) times daily. Melt into 10 ml of water and take by mouth when dissolved, Disp: 60 tablet, Rfl: 3 .  tiZANidine (ZANAFLEX) 4 MG capsule,  Take 4 mg by mouth 3 (three) times daily as needed for muscle spasms., Disp: , Rfl:  .  traMADol (ULTRAM) 50 MG tablet, Take 50 mg by mouth every 6 (six) hours as needed for moderate pain., Disp: , Rfl:  .  triamcinolone ointment (KENALOG) 0.1 %, , Disp: , Rfl:  .  verapamil (CALAN-SR) 120 MG CR tablet, Take 1 tablet (120 mg total) by mouth at bedtime., Disp: 90 tablet, Rfl: 3 .  verapamil (CALAN-SR) 240 MG CR tablet, Take 240 mg by mouth every morning., Disp: , Rfl:  .  Vitamin D, Ergocalciferol, (DRISDOL) 50000 units CAPS capsule, Take 50,000 Units by mouth daily., Disp: , Rfl:  .  zolpidem (AMBIEN) 5 MG tablet, Take 5 mg by mouth at bedtime as needed for sleep., Disp: , Rfl:   Current Facility-Administered Medications:  .  0.9 %  sodium chloride infusion, 500 mL, Intravenous, Once, Nandigam, Eleonore ChiquitoKavitha V, MD   Allergies  Allergen Reactions  . Nitrofuran Derivatives Shortness Of Breath, Nausea And Vomiting and Palpitations    Sweating, coldness, weakness in legs, lightheadedness, no appetite  . Zyvox [Linezolid] Rash  . Amlodipine Besylate Hives and Nausea Only    Nightmares   . Amoxicillin Hives  . Buspirone     Dizziness   . Cefotetan  Hives  . Cephalosporins Hives and Diarrhea  . Ciprofloxacin   . Cocamide Hives and Diarrhea  . Codeine Diarrhea and Nausea And Vomiting  . Cyclobenzaprine Diarrhea and Nausea And Vomiting  . Diclofenac     Dizziness, blurred vision and ringing in the ears.   . Latex Hives  . Macrobid [Nitrofurantoin Macrocrystal] Nausea And Vomiting  . Metronidazole   . Norvasc [Amlodipine] Hives, Diarrhea and Nausea And Vomiting  . Paroxetine   . Triple Antibiotic [Bacitracin-Neomycin-Polymyxin]   . Valdecoxib   . Zocor [Simvastatin]     Headache, migraine  . Adhesive [Tape] Rash  . Anesthetics, Amide Rash  . Bactrim [Sulfamethoxazole-Trimethoprim] Nausea Only and Rash  . Cefuroxime Axetil Hives, Nausea And Vomiting and Rash    Gi upset   . Celecoxib  Hives and Rash  . Clarithromycin Nausea Only and Rash  . Coreg [Carvedilol] Hives and Rash  . Crestor [Rosuvastatin Calcium] Rash  . Doxycycline Diarrhea and Rash  . Lipitor [Atorvastatin Calcium] Hives and Rash    headache   . Neomycin-Bacitracin Zn-Polymyx Hives and Rash  . Neosporin [Neomycin-Polymyxin-Gramicidin] Hives and Rash  . Penicillins Diarrhea, Nausea And Vomiting and Rash  . Plavix [Clopidogrel] Nausea And Vomiting and Rash  . Prochlorperazine Edisylate Hives and Rash  . Valtrex [Valacyclovir Hcl] Hives, Diarrhea, Nausea And Vomiting and Rash  . Vytorin [Ezetimibe-Simvastatin] Hives, Diarrhea, Nausea And Vomiting and Rash    Muscle pain and cramps  . Zofran [Ondansetron Hcl] Hives and Rash     Objective: Vitals:   04/20/19 1554  Temp: 98.4 F (36.9 C)    Physical Examination: Vitals:   04/20/19 1554  Temp: 98.4 F (36.9 C)   Vascular  Examination: Capillary refill time <3 seconds x 10 digits.  Palpable DP/PT pulses b/l.  Digital hair absent b/l.  No edema noted b/l.  Skin temperature gradient WNL b/l.  Dermatological Examination: Skin thin and atrophic b/l.  No open wounds b/l.  No interdigital macerations noted b/l.  Elongated, thick, discolored brittle toenails with subungual debris and pain on dorsal palpation of nailbeds 1-5 b/l.  Hyperkeratotic lesion(s) noted medial 2nd PIPJ left foot, submet head 1, 5 b/l. No erythema, no edema, no drainage, no flocculence.  Musculoskeletal Examination: Muscle strength 5/5 to all muscle groups b/l.  Hammertoes 2-5 b/l  Neurological Examination: Sensation intact 5/5 b/l with 10 gram monofilament.  Vibratory sensation intact b/l.  Proprioceptive sensation intact b/l.  Assessment: 1. Mycotic nail infection with pain 1-5 b/l 2. Corn left 2nd digits 3. Calluses submet head 1, 5 b/l  Plan: 1. Toenails 1-5 b/l were debrided in length and girth without iatrogenic laceration. 2. Painful  hyperkeratotic lesion pared left 2nd digit, and submet heads 1, 5 b/l. Continue padding daily between toes. 3. Continue soft, supportive shoe gear daily. 4. Report any pedal injuries to medical professional. 5. Follow up 9 weeks. 6. Patient/POA to call should there be a question/concern in there interim.

## 2019-05-25 ENCOUNTER — Other Ambulatory Visit: Payer: Self-pay | Admitting: Internal Medicine

## 2019-06-10 ENCOUNTER — Other Ambulatory Visit: Payer: Self-pay | Admitting: Internal Medicine

## 2019-06-16 ENCOUNTER — Ambulatory Visit (INDEPENDENT_AMBULATORY_CARE_PROVIDER_SITE_OTHER): Payer: Medicare Other | Admitting: Gastroenterology

## 2019-06-16 ENCOUNTER — Telehealth: Payer: Self-pay | Admitting: Gastroenterology

## 2019-06-16 ENCOUNTER — Encounter: Payer: Self-pay | Admitting: Gastroenterology

## 2019-06-16 VITALS — Ht 61.0 in | Wt 90.0 lb

## 2019-06-16 DIAGNOSIS — R1310 Dysphagia, unspecified: Secondary | ICD-10-CM

## 2019-06-16 DIAGNOSIS — K219 Gastro-esophageal reflux disease without esophagitis: Secondary | ICD-10-CM

## 2019-06-16 DIAGNOSIS — K222 Esophageal obstruction: Secondary | ICD-10-CM

## 2019-06-16 DIAGNOSIS — R1319 Other dysphagia: Secondary | ICD-10-CM

## 2019-06-16 NOTE — Telephone Encounter (Signed)
Returned call

## 2019-06-16 NOTE — Telephone Encounter (Signed)
Patient daughter called stating she was return your call to go over mothers medication.

## 2019-06-16 NOTE — Patient Instructions (Addendum)
You have been scheduled for an endoscopy. Please follow written instructions given to you at your visit today. If you use inhalers (even only as needed), please bring them with you on the day of your procedure.   I appreciate the  opportunity to care for you  Thank You   Marsa ArisKavitha Haadi Santellan , MD    Dysphagia Eating Plan, Bite Size Food This diet plan is for people with moderate swallowing problems who have transitioned from pureed and minced foods. Bite size foods are soft and cut into small chunks so that they can be swallowed safely. On this eating plan, you may be instructed to drink liquids that are thickened. Work with your health care provider and your diet and nutrition specialist (dietitian) to make sure that you are following the diet safely and getting all the nutrients you need. What are tips for following this plan? General guidelines for foods   You may eat foods that are tender, soft, and moist.  Always test food texture before taking a bite. Poke food with a fork or spoon to make sure it is tender.  Food should be easy to cut and shew. Avoid large pieces of food that require a lot of chewing.  Take small bites. Each bite should be smaller than your thumb nail (about 15mm by 15 mm).  If you were on pureed and minced food diet plans, you may eat any of the foods included in those diets.  Avoid foods that are very dry, hard, sticky, chewy, coarse, or crunchy.  If instructed by your health care provider, thicken liquids. Follow your health care provider's instructions for what products to use, how to do this, and to what thickness. ? Your health care provider may recommend using a commercial thickener, rice cereal, or potato flakes. Ask your health care provider to recommend thickeners. ? Thickened liquids are usually a "pudding-like" consistency, or they may be as thick as honey or thick enough to eat with a spoon. Cooking  To moisten foods, you may add liquids while you  are blending, mashing, or grinding your foods to the right consistency. These liquids include gravies, sauces, vegetable or fruit juice, milk, half and half, or water.  Strain extra liquid from foods before eating.  Reheat foods slowly to prevent a tough crust from forming.  Prepare foods in advance. Meal planning  Eat a variety of foods to get all the nutrients you need.  Some foods may be tolerated better than others. Work with your dietitian to identify which foods are safest for you to eat.  Follow your meal plan as told by your dietitian. What foods are allowed? Grains Moist breads without nuts or seeds. Biscuits, muffins, pancakes, and waffles that are well-moistened with syrup, jelly, margarine, or butter. Cooked cereals. Moist bread stuffing. Moist rice. Well-moistened cold cereal with small chunks. Well-cooked pasta, noodles, rice, and bread dressing in small pieces and thick sauce. Soft dumplings or spaetzle in small pieces and butter or gravy. Vegetables Soft, well-cooked vegetables in small pieces. Soft-cooked, mashed potatoes. Thickened vegetable juice. Fruits Canned or cooked fruits that are soft or moist and do not have skin or seeds. Fresh, soft bananas. Thickened fruit juices. Meat and other protein foods Tender, moist meats or poultry in small pieces. Moist meatballs or meatloaf. Fish without bones. Eggs or egg substitutes in small pieces. Tofu. Tempeh and meat alternatives in small pieces. Well-cooked, tender beans, peas, baked beans, and other legumes. Dairy Thickened milk. Cream cheese. Yogurt. Cottage cheese.  Sour cream. Small pieces of soft cheese. Fats and oils Butter. Oils. Margarine. Mayonnaise. Gravy. Spreads. Sweets and desserts Soft, smooth, moist desserts. Pudding. Custard. Moist cakes. Jam. Jelly. Honey. Preserves. Ask your health care provider whether you can have frozen desserts. Seasoning and other foods All seasonings and sweeteners. All sauces with  small chunks. Prepared tuna, egg, or chicken salad without raw fruits or vegetables. Moist casseroles with small, tender pieces of meat. Soups with tender meat. What foods are not allowed? Grains Coarse or dry cereals. Dry breads. Toast. Crackers. Tough, crusty breads, such as [place] bread and baguettes. Dry pancakes, waffles, and muffins. Sticky rice. Dry bread stuffing. Granola. Popcorn. Chips. Vegetables All raw vegetables. Cooked corn. Rubbery or stiff cooked vegetables. Stringy vegetables, such as celery. Tough, crisp fried potatoes. Potato skins. Fruits Hard, crunchy, stringy, high-pulp, and juicy raw fruits such as apples, pineapple, papaya, and watermelon. Small, round fruits, such as grapes. Dried fruit and fruit leather. Meat and other protein foods Large pieces of meat. Dry, tough meats, such as bacon, sausage, and hot dogs. Chicken, [place], or fish with skin and bones. Crunchy peanut butter. Nuts. Seeds. Nut and seed butters. Dairy Yogurt with nuts, seeds, or large chunks. Large chunks of cheese. Frozen desserts and milk consistency not allowed by your dietitian. Sweets and desserts Dry cakes. Chewy or dry cookies. Any desserts with nuts, seeds, dry fruits, coconut, pineapple, or anything dry, sticky, or hard. Chewy caramel. Licorice. Taffy-type candies. Ask your health care provider whether you can have frozen desserts. Seasoning and other foods Soups with tough or large chunks of meats, poultry, or vegetables. Corn or clam chowder. Smoothies with large chunks of fruit. Summary  Bite size foods can be helpful for people with moderate swallowing problems.  On the dysphagia eating plan, you may eat foods that are soft, moist, and cut into pieces smaller than 12mm by 55mm.  You may be instructed to thicken liquids. Follow your health care provider's instructions about how to do this and to what consistency. This information is not intended to replace advice given to you by your  health care provider. Make sure you discuss any questions you have with your health care provider. Document Released: 11/19/2005 Document Revised: 03/12/2019 Document Reviewed: 03/01/2017 Elsevier Patient Education  2020 [employer].

## 2019-06-16 NOTE — Progress Notes (Signed)
Lisabeth DevoidSheila M Zwiefelhofer    [id number]    [birth year]  Primary Care Physician:South, Jeannett SeniorStephen, MD  Referring Physician: Adrian PrinceSouth, Stephen, MD [address] [place],  [place] [id number]  This service was provided via  telemedicine due to COVID 19 pandemic.  I connected with@ on 06/16/19 at  3:00 PM EDT by a video enabled telemedicine application and verified that I am speaking with the correct person using two identifiers.  Patient location: Home Provider location: Office   I discussed the limitations, risks, security and privacy concerns of performing an evaluation and management service by video enabled telemedicine application and the availability of in person appointments. I also discussed with the patient that there may be a patient responsible charge related to this service. The patient expressed understanding and agreed to proceed.   The persons participating in this telemedicine service were myself and the patient    Chief complaint: Dysphagia HPI:  85 year old female with history of chronic GERD, esophageal stricture with complaints of recurrent solid dysphagia.  EGD May 19, 2018 distal esophageal stricture dilated with TTS balloon to 15.5 mm.  Few gastric clean-based ulcers, H. pylori negative  Swallowing improved after EGD with dilation but started noticing difficulty in the past few months especially with dense bread or meat.  Last colonoscopy May 2018 with removal of 5 mm polyp from ascending colon, pathology report not available.  Per patient she was recommended to undergo repeat colonoscopy annually.  She continues to worry about potential colon cancer, requesting surveillance colonoscopy.  Previously she was getting frequent colonoscopies for unclear reason.  Outpatient Encounter Medications as of 06/16/2019  Medication Sig  . aspirin 325 MG tablet Take 325 mg by mouth daily.  . Biotin 1000 MCG tablet Take 1,000 mcg by mouth 3 (three) times daily.  .  clobetasol ointment (TEMOVATE) 0.05 % Apply 1 application topically as needed.  . dicyclomine (BENTYL) 10 MG capsule Take 10 mg by mouth 2 (two) times daily as needed for spasms.  Marland Kitchen. ezetimibe (ZETIA) 10 MG tablet TAKE 1 TABLET BY MOUTH  DAILY  . fluticasone (FLONASE) 50 MCG/ACT nasal spray U 2 SPRAYS ONCE IEN D  . gabapentin (NEURONTIN) 300 MG capsule Take 600 mg by mouth 2 (two) times daily.  Marland Kitchen. gemfibrozil (LOPID) 600 MG tablet Take 600 mg by mouth 2 (two) times daily before a meal.  . gentamicin ointment (GARAMYCIN) 0.1 % Apply 1 application topically 2 (two) times daily.  Marland Kitchen. levofloxacin (LEVAQUIN) 500 MG tablet TK 1 T PO D FOR 7 DAYS  . omeprazole (PRILOSEC) 40 MG capsule Take 1 capsule (40 mg total) by mouth daily.  . predniSONE (DELTASONE) 5 MG tablet TK 1/2 T PO D FOR 4 DAYS  . sucralfate (CARAFATE) 1 g tablet Take 1 tablet (1 g total) by mouth 2 (two) times daily. Melt into 10 ml of water and take by mouth when dissolved  . tiZANidine (ZANAFLEX) 4 MG capsule Take 4 mg by mouth 3 (three) times daily as needed for muscle spasms.  . traMADol (ULTRAM) 50 MG tablet Take 50 mg by mouth every 6 (six) hours as needed for moderate pain.  Marland Kitchen. triamcinolone ointment (KENALOG) 0.1 %   . verapamil (CALAN-SR) 120 MG CR tablet Take 1 tablet (120 mg total) by mouth at bedtime. OFFICE VISIT NEEDED  . verapamil (CALAN-SR) 240 MG CR tablet Take 240 mg by mouth every morning.  . Vitamin D, Ergocalciferol, (DRISDOL) 50000 units CAPS capsule  Take 50,000 Units by mouth daily.  Marland Kitchen. zolpidem (AMBIEN) 5 MG tablet Take 5 mg by mouth at bedtime as needed for sleep.  . ranitidine (ZANTAC) 300 MG tablet Take 1 tablet (300 mg total) by mouth at bedtime. (Patient not taking: Reported on 06/16/2019)  . [DISCONTINUED] ranitidine (ZANTAC) 150 MG tablet Take 150 mg by mouth daily as needed for heartburn.   Facility-Administered Encounter Medications as of 06/16/2019  Medication  . 0.9 %  sodium chloride infusion    Allergies  as of 06/16/2019 - Review Complete 04/27/2019  Allergen Reaction Noted  . Nitrofuran derivatives Shortness Of Breath, Nausea And Vomiting, and Palpitations 02/26/2018  . Zyvox [linezolid] Rash 02/26/2018  . Amlodipine besylate Hives and Nausea Only 02/26/2018  . Amoxicillin Hives 02/26/2018  . Buspirone  02/26/2018  . Cefotetan Hives 02/26/2018  . Cephalosporins Hives and Diarrhea 02/26/2018  . Ciprofloxacin  02/26/2018  . Cocamide Hives and Diarrhea 02/26/2018  . Codeine Diarrhea and Nausea And Vomiting 02/26/2018  . Cyclobenzaprine Diarrhea and Nausea And Vomiting 02/26/2018  . Diclofenac  02/26/2018  . Latex Hives 02/26/2018  . Macrobid [nitrofurantoin macrocrystal] Nausea And Vomiting 02/26/2018  . Metronidazole  02/26/2018  . Norvasc [amlodipine] Hives, Diarrhea, and Nausea And Vomiting 02/26/2018  . Paroxetine  02/26/2018  . Triple antibiotic [bacitracin-neomycin-polymyxin]  02/26/2018  . Valdecoxib  02/26/2018  . Zocor [simvastatin]  02/26/2018  . Adhesive [tape] Rash 02/26/2018  . Anesthetics, amide Rash 02/26/2018  . Bactrim [sulfamethoxazole-trimethoprim] Nausea Only and Rash 02/26/2018  . Cefuroxime axetil Hives, Nausea And Vomiting, and Rash 02/26/2018  . Celecoxib Hives and Rash 02/26/2018  . Clarithromycin Nausea Only and Rash 02/26/2018  . Coreg [carvedilol] Hives and Rash 02/26/2018  . Crestor [rosuvastatin calcium] Rash 02/26/2018  . Doxycycline Diarrhea and Rash 02/26/2018  . Lipitor [atorvastatin calcium] Hives and Rash 02/26/2018  . Neomycin-bacitracin zn-polymyx Hives and Rash 02/26/2018  . Neosporin [neomycin-polymyxin-gramicidin] Hives and Rash 02/26/2018  . Penicillins Diarrhea, Nausea And Vomiting, and Rash 02/26/2018  . Plavix [clopidogrel] Nausea And Vomiting and Rash 02/26/2018  . Prochlorperazine edisylate Hives and Rash 02/26/2018  . Valtrex [valacyclovir hcl] Hives, Diarrhea, Nausea And Vomiting, and Rash 02/26/2018  . Vytorin  [ezetimibe-simvastatin] Hives, Diarrhea, Nausea And Vomiting, and Rash 02/26/2018  . Zofran [ondansetron hcl] Hives and Rash 02/26/2018    Past Medical History:  Diagnosis Date  . Anemia   . Arthritis   . Blood transfusion without reported diagnosis   . Cataract   . CKD (chronic kidney disease)   . Clotting disorder (HCC)   . Colon polyps   . Diverticulosis   . GERD (gastroesophageal reflux disease)   . Heart murmur   . Hyperlipidemia   . Hypertension   . Osteoporosis   . Stroke (HCC)   . TIA (transient ischemic attack)     Past Surgical History:  Procedure Laterality Date  . BACK SURGERY    . CHOLECYSTECTOMY    . COLON SURGERY    . FOOT SURGERY Bilateral   . HERNIA REPAIR    . INCONTINENCE SURGERY    . LEFT HEART CATH AND CORONARY ANGIOGRAPHY N/A 02/07/2018   Procedure: LEFT HEART CATH AND CORONARY ANGIOGRAPHY;  Surgeon: Dolores PattyBensimhon, Daniel R, MD;  Location: [facility];  Service: Cardiovascular;  Laterality: N/A;    Family History  Problem Relation Age of Onset  . Hypertension Father   . Tuberculosis Father   . Heart disease Mother   . Stroke Sister   . Stomach cancer Neg  Hx   . Colon cancer Neg Hx   . Esophageal cancer Neg Hx   . Liver cancer Neg Hx   . Pancreatic cancer Neg Hx   . Rectal cancer Neg Hx     Social History   Socioeconomic History  . Marital status: Widowed    Spouse name: Not on file  . Number of children: Not on file  . Years of education: Not on file  . Highest education level: Not on file  Occupational History  . Not on file  Social Needs  . Financial resource strain: Not on file  . Food insecurity    Worry: Not on file    Inability: Not on file  . Transportation needs    Medical: Not on file    Non-medical: Not on file  Tobacco Use  . Smoking status: Former Research scientist (life sciences)  . Smokeless tobacco: Never Used  . Tobacco comment: Quit 30 years ago.  Substance and Sexual Activity  . Alcohol use: No    Frequency: Never  . Drug use: No   . Sexual activity: Never  Lifestyle  . Physical activity    Days per week: Not on file    Minutes per session: Not on file  . Stress: Not on file  Relationships  . Social Herbalist on phone: Not on file    Gets together: Not on file    Attends religious service: Not on file    Active member of club or organization: Not on file    Attends meetings of clubs or organizations: Not on file    Relationship status: Not on file  . Intimate partner violence    Fear of current or ex partner: Not on file    Emotionally abused: Not on file    Physically abused: Not on file    Forced sexual activity: Not on file  Other Topics Concern  . Not on file  Social History Narrative  . Not on file      Review of systems: Review of Systems as per HPI All other systems reviewed and are negative.   Observations/Objective:   Data Reviewed:  Reviewed labs, radiology imaging, old records and pertinent past GI work up   Assessment and Plan/Recommendations:  85 year old female with history of chronic IBS, chronic GERD, esophageal stricture with solid dysphagia  We will schedule for EGD with possible esophageal dilation  Continue PPI daily  Discussed antireflux measures and lifestyle modifications  Explained to patient in detail the potential risks associated with colonoscopy and anesthesia and do not recommend doing surveillance colonoscopy at her age based on current guidelines if she is asymptomatic.     I discussed the assessment and treatment plan with the patient. The patient was provided an opportunity to ask questions and all were answered. The patient agreed with the plan and demonstrated an understanding of the instructions.   The patient was advised to call back or seek an in-person evaluation if the symptoms worsen or if the condition fails to improve as anticipated.     Harl Bowie, MD   CC: Reynold Bowen, MD

## 2019-07-20 ENCOUNTER — Other Ambulatory Visit: Payer: Self-pay

## 2019-07-20 ENCOUNTER — Encounter: Payer: Self-pay | Admitting: Podiatry

## 2019-07-20 ENCOUNTER — Ambulatory Visit (INDEPENDENT_AMBULATORY_CARE_PROVIDER_SITE_OTHER): Payer: Medicare Other | Admitting: Podiatry

## 2019-07-20 DIAGNOSIS — M79675 Pain in left toe(s): Secondary | ICD-10-CM

## 2019-07-20 DIAGNOSIS — Z9229 Personal history of other drug therapy: Secondary | ICD-10-CM | POA: Diagnosis not present

## 2019-07-20 DIAGNOSIS — L84 Corns and callosities: Secondary | ICD-10-CM | POA: Diagnosis not present

## 2019-07-20 DIAGNOSIS — B351 Tinea unguium: Secondary | ICD-10-CM

## 2019-07-20 DIAGNOSIS — M79674 Pain in right toe(s): Secondary | ICD-10-CM | POA: Diagnosis not present

## 2019-07-20 NOTE — Patient Instructions (Addendum)
Corns and Calluses Corns are small areas of thickened skin that occur on the top, sides, or tip of a toe. They contain a cone-shaped core with a point that can press on a nerve below. This causes pain.  Calluses are areas of thickened skin that can occur anywhere on the body, including the hands, fingers, palms, soles of the feet, and heels. Calluses are usually larger than corns. What are the causes? Corns and calluses are caused by rubbing (friction) or pressure, such as from shoes that are too tight or do not fit properly. What increases the risk? Corns are more likely to develop in people who have misshapen toes (toe deformities), such as hammer toes. Calluses can occur with friction to any area of the skin. They are more likely to develop in people who:  Work with their hands.  Wear shoes that fit poorly, are too tight, or are high-heeled.  Have toe deformities. What are the signs or symptoms? Symptoms of a corn or callus include:  A hard growth on the skin.  Pain or tenderness under the skin.  Redness and swelling.  Increased discomfort while wearing tight-fitting shoes, if your feet are affected. If a corn or callus becomes infected, symptoms may include:  Redness and swelling that gets worse.  Pain.  Fluid, blood, or pus draining from the corn or callus. How is this diagnosed? Corns and calluses may be diagnosed based on your symptoms, your medical history, and a physical exam. How is this treated? Treatment for corns and calluses may include:  Removing the cause of the friction or pressure. This may involve: ? Changing your shoes. ? Wearing shoe inserts (orthotics) or other protective layers in your shoes, such as a corn pad. ? Wearing gloves.  Applying medicine to the skin (topical medicine) to help soften skin in the hardened, thickened areas.  Removing layers of dead skin with a file to reduce the size of the corn or callus.  Removing the corn or callus with a  scalpel or laser.  Taking antibiotic medicines, if your corn or callus is infected.  Having surgery, if a toe deformity is the cause. Follow these instructions at home:   Take over-the-counter and prescription medicines only as told by your health care provider.  If you were prescribed an antibiotic, take it as told by your health care provider. Do not stop taking it even if your condition starts to improve.  Wear shoes that fit well. Avoid wearing high-heeled shoes and shoes that are too tight or too loose.  Wear any padding, protective layers, gloves, or orthotics as told by your health care provider.  Soak your hands or feet and then use a file or pumice stone to soften your corn or callus. Do this as told by your health care provider.  Check your corn or callus every day for symptoms of infection. Contact a health care provider if you:  Notice that your symptoms do not improve with treatment.  Have redness or swelling that gets worse.  Notice that your corn or callus becomes painful.  Have fluid, blood, or pus coming from your corn or callus.  Have new symptoms. Summary  Corns are small areas of thickened skin that occur on the top, sides, or tip of a toe.  Calluses are areas of thickened skin that can occur anywhere on the body, including the hands, fingers, palms, and soles of the feet. Calluses are usually larger than corns.  Corns and calluses are caused by   rubbing (friction) or pressure, such as from shoes that are too tight or do not fit properly.  Treatment may include wearing any padding, protective layers, gloves, or orthotics as told by your health care provider. This information is not intended to replace advice given to you by your health care provider. Make sure you discuss any questions you have with your health care provider. Document Released: 08/25/2004 Document Revised: 03/11/2019 Document Reviewed: 10/02/2017 Elsevier Patient Education  2020 [employer]   Onychomycosis/Fungal Toenails  WHAT IS IT? An infection that lies within the keratin of your nail plate that is caused by a fungus.  WHY ME? Fungal infections affect all ages, sexes, races, and creeds.  There may be many factors that predispose you to a fungal infection such as age, coexisting medical conditions such as diabetes, or an autoimmune disease; stress, medications, fatigue, genetics, etc.  Bottom line: fungus thrives in a warm, moist environment and your shoes offer such a location.  IS IT CONTAGIOUS? Theoretically, yes.  You do not want to share shoes, nail clippers or files with someone who has fungal toenails.  Walking around barefoot in the same room or sleeping in the same bed is unlikely to transfer the organism.  It is important to realize, however, that fungus can spread easily from one nail to the next on the same foot.  HOW DO WE TREAT THIS?  There are several ways to treat this condition.  Treatment may depend on many factors such as age, medications, pregnancy, liver and kidney conditions, etc.  It is best to ask your doctor which options are available to you.  1. No treatment.   Unlike many other medical concerns, you can live with this condition.  However for many people this can be a painful condition and may lead to ingrown toenails or a bacterial infection.  It is recommended that you keep the nails cut short to help reduce the amount of fungal nail. 2. Topical treatment.  These range from herbal remedies to prescription strength nail lacquers.  About 40-50% effective, topicals require twice daily application for approximately 9 to 12 months or until an entirely new nail has grown out.  The most effective topicals are medical grade medications available through physicians offices. 3. Oral antifungal medications.  With an 80-90% cure rate, the most common oral medication requires 3 to 4 months of therapy and stays in your system for a year as the new nail grows out.   Oral antifungal medications do require blood work to make sure it is a safe drug for you.  A liver function panel will be performed prior to starting the medication and after the first month of treatment.  It is important to have the blood work performed to avoid any harmful side effects.  In general, this medication safe but blood work is required. 4. Laser Therapy.  This treatment is performed by applying a specialized laser to the affected nail plate.  This therapy is noninvasive, fast, and non-painful.  It is not covered by insurance and is therefore, out of pocket.  The results have been very good with a 80-95% cure rate.  [facility] is the only practice in the area to offer this therapy. 5. Permanent Nail Avulsion.  Removing the entire nail so that a new nail will not grow back. 

## 2019-07-21 ENCOUNTER — Ambulatory Visit: Payer: Medicare Other | Admitting: Podiatry

## 2019-07-24 ENCOUNTER — Telehealth: Payer: Self-pay | Admitting: Gastroenterology

## 2019-07-24 NOTE — Telephone Encounter (Signed)
Returned call and answered "NO" to all screening questions.  Pt made aware that car partner may wait in the car or come up to the lobby during procedure as long as they are wearing a mask.

## 2019-07-24 NOTE — Telephone Encounter (Signed)

## 2019-07-27 ENCOUNTER — Encounter: Payer: Medicare Other | Admitting: Gastroenterology

## 2019-07-27 ENCOUNTER — Telehealth: Payer: Self-pay | Admitting: Gastroenterology

## 2019-07-27 NOTE — Telephone Encounter (Signed)
Sorry to hear that , called her daughter and conveyed my condolences.  She passed away peacefully in sleep. No charge. Thanks

## 2019-07-30 NOTE — Progress Notes (Signed)
Subjective: Jill Mcdonald presents to clinic with cc of corns and calluses which are aggravated when weightbearing with and without shoe gear.  This pain limits her daily activities. Pain symptoms resolve with periodic professional debridement. She also has elongated, thick toenails which are tender.  Adrian [place], Stephen, MD is her PCP.    Current Outpatient Medications:  .  aspirin 325 MG tablet, Take 325 mg by mouth daily., Disp: , Rfl:  .  Biotin 1000 MCG tablet, Take 1,000 mcg by mouth 3 (three) times daily., Disp: , Rfl:  .  clobetasol ointment (TEMOVATE) 0.05 %, Apply 1 application topically as needed., Disp: , Rfl:  .  dicyclomine (BENTYL) 10 MG capsule, Take 10 mg by mouth 2 (two) times daily as needed for spasms., Disp: , Rfl:  .  ezetimibe (ZETIA) 10 MG tablet, TAKE 1 TABLET BY MOUTH  DAILY, Disp: 90 tablet, Rfl: 1 .  fluticasone (FLONASE) 50 MCG/ACT nasal spray, U 2 SPRAYS ONCE IEN D, Disp: , Rfl: 1 .  gabapentin (NEURONTIN) 300 MG capsule, Take 600 mg by mouth 2 (two) times daily., Disp: , Rfl:  .  gemfibrozil (LOPID) 600 MG tablet, Take 600 mg by mouth 2 (two) times daily before a meal., Disp: , Rfl:  .  gentamicin ointment (GARAMYCIN) 0.1 %, Apply 1 application topically 2 (two) times daily., Disp: , Rfl:  .  levofloxacin (LEVAQUIN) 500 MG tablet, TK 1 T PO D FOR 7 DAYS, Disp: , Rfl: 0 .  omeprazole (PRILOSEC) 40 MG capsule, Take 1 capsule (40 mg total) by mouth daily., Disp: 90 capsule, Rfl: 3 .  predniSONE (DELTASONE) 5 MG tablet, TK 1/2 T PO D FOR 4 DAYS, Disp: , Rfl: 0 .  ranitidine (ZANTAC) 300 MG tablet, Take 1 tablet (300 mg total) by mouth at bedtime. (Patient not taking: Reported on 06/16/2019), Disp: 90 tablet, Rfl: 3 .  sucralfate (CARAFATE) 1 g tablet, Take 1 tablet (1 g total) by mouth 2 (two) times daily. Melt into 10 ml of water and take by mouth when dissolved, Disp: 60 tablet, Rfl: 3 .  tiZANidine (ZANAFLEX) 4 MG capsule, Take 4 mg by mouth 3 (three) times daily as  needed for muscle spasms., Disp: , Rfl:  .  traMADol (ULTRAM) 50 MG tablet, Take 50 mg by mouth every 6 (six) hours as needed for moderate pain., Disp: , Rfl:  .  triamcinolone ointment (KENALOG) 0.1 %, , Disp: , Rfl:  .  verapamil (CALAN-SR) 120 MG CR tablet, Take 1 tablet (120 mg total) by mouth at bedtime. OFFICE VISIT NEEDED, Disp: 90 tablet, Rfl: 0 .  verapamil (CALAN-SR) 240 MG CR tablet, Take 240 mg by mouth every morning., Disp: , Rfl:  .  Vitamin D, Ergocalciferol, (DRISDOL) 50000 units CAPS capsule, Take 50,000 Units by mouth daily., Disp: , Rfl:  .  zolpidem (AMBIEN) 5 MG tablet, Take 5 mg by mouth at bedtime as needed for sleep., Disp: , Rfl:   Current Facility-Administered Medications:  .  0.9 %  sodium chloride infusion, 500 mL, Intravenous, Once, Nandigam, Eleonore ChiquitoKavitha V, MD   Allergies  Allergen Reactions  . Nitrofuran Derivatives Shortness Of Breath, Nausea And Vomiting and Palpitations    Sweating, coldness, weakness in legs, lightheadedness, no appetite  . Zyvox [Linezolid] Rash  . Amlodipine Besylate Hives and Nausea Only    Nightmares   . Amoxicillin Hives  . Buspirone     Dizziness   . Cefotetan Hives  . Cephalosporins Hives and Diarrhea  .  Ciprofloxacin   . Cocamide Hives and Diarrhea  . Codeine Diarrhea and Nausea And Vomiting  . Cyclobenzaprine Diarrhea and Nausea And Vomiting  . Diclofenac     Dizziness, blurred vision and ringing in the ears.   . Latex Hives  . Macrobid [Nitrofurantoin Macrocrystal] Nausea And Vomiting  . Metronidazole   . Norvasc [Amlodipine] Hives, Diarrhea and Nausea And Vomiting  . Paroxetine   . Triple Antibiotic [Bacitracin-Neomycin-Polymyxin]   . Valdecoxib   . Zocor [Simvastatin]     Headache, migraine  . Adhesive [Tape] Rash  . Anesthetics, Amide Rash  . Bactrim [Sulfamethoxazole-Trimethoprim] Nausea Only and Rash  . Cefuroxime Axetil Hives, Nausea And Vomiting and Rash    Gi upset   . Celecoxib Hives and Rash  .  Clarithromycin Nausea Only and Rash  . Coreg [Carvedilol] Hives and Rash  . Crestor [Rosuvastatin Calcium] Rash  . Doxycycline Diarrhea and Rash  . Lipitor [Atorvastatin Calcium] Hives and Rash    headache   . Neomycin-Bacitracin Zn-Polymyx Hives and Rash  . Neosporin [Neomycin-Polymyxin-Gramicidin] Hives and Rash  . Penicillins Diarrhea, Nausea And Vomiting and Rash  . Plavix [Clopidogrel] Nausea And Vomiting and Rash  . Prochlorperazine Edisylate Hives and Rash  . Valtrex [Valacyclovir Hcl] Hives, Diarrhea, Nausea And Vomiting and Rash  . Vytorin [Ezetimibe-Simvastatin] Hives, Diarrhea, Nausea And Vomiting and Rash    Muscle pain and cramps  . Zofran [Ondansetron Hcl] Hives and Rash     Objective:  Physical Examination:  Vascular  Examination: Capillary refill time <3 seconds x 10 digits.  Palpable DP pulse right; nonpalpable left foot.  PT pulse palpable b/l.  Digital hair absent b/l.  No edema noted b/l.  Skin temperature gradient WNL b/l.  Dermatological Examination: Skin thin and atrophic b/l.  No open wounds b/l.  No interdigital macerations noted b/l.  Elongated, thick, discolored brittle toenails with subungual debris and pain on dorsal palpation of nailbeds 1-5 b/l.  Hyperkeratotic lesion medial 2nd PIPJ left foot, submet head 1, 5 b/l. No erythema, no edema, no drainage, no flocculence noted.  Musculoskeletal Examination: Muscle strength 5/5 to all muscle groups b/l.  Hammertoes 2-5 b/l.  No pain, crepitus or joint discomfort with active/passive ROM.  Neurological Examination: Sensation intact 5/5 b/l with 10 gram monofilament.  Vibratory sensation intact b/l.  Proprioceptive sensation intact b/l.  Assessment: 1. Painful corn left 2nd digit 2. Calluses submet heads 1, 5 b/l 3. Painful mycotic nails 1-5 b/l  Plan: 1. Hyperkeratotic lesions pared left 2nd digit, submet heads 1, 5 b/l without incident. 2. Toeanails 1-5 b/l debrided in length  and girth without complication. 3. Continue soft, supportive shoe gear daily. 4. Report any pedal injuries to medical professional. 5. Follow up 9 weeks. 6. Patient/POA to call should there be a question/concern in there interim.

## 2019-08-04 DEATH — deceased

## 2019-09-03 ENCOUNTER — Ambulatory Visit: Payer: Medicare Other | Admitting: Internal Medicine

## 2019-09-21 ENCOUNTER — Ambulatory Visit: Payer: Medicare Other | Admitting: Podiatry

## 2019-10-03 ENCOUNTER — Encounter (INDEPENDENT_AMBULATORY_CARE_PROVIDER_SITE_OTHER): Payer: Self-pay

## 2019-11-02 ENCOUNTER — Encounter
